# Patient Record
Sex: Male | Born: 1960 | Race: White | Hispanic: No | Smoking: Never smoker
Health system: Southern US, Community
[De-identification: ages and names within clinical notes are randomized; demographics above are authoritative.]

## PROBLEM LIST (undated history)

## (undated) DIAGNOSIS — C61 Malignant neoplasm of prostate: Secondary | ICD-10-CM

## (undated) DIAGNOSIS — I10 Essential (primary) hypertension: Secondary | ICD-10-CM

## (undated) DIAGNOSIS — M1990 Unspecified osteoarthritis, unspecified site: Secondary | ICD-10-CM

## (undated) DIAGNOSIS — G43909 Migraine, unspecified, not intractable, without status migrainosus: Secondary | ICD-10-CM

## (undated) HISTORY — PX: OTHER SURGICAL HISTORY: SHX169

## (undated) HISTORY — PX: BACK SURGERY: SHX140

## (undated) HISTORY — PX: WISDOM TOOTH EXTRACTION: SHX21

## (undated) HISTORY — PX: FRACTURE SURGERY: SHX138

---

## 1999-02-11 ENCOUNTER — Encounter: Payer: Self-pay | Admitting: Emergency Medicine

## 1999-02-11 ENCOUNTER — Emergency Department (HOSPITAL_COMMUNITY): Admission: EM | Admit: 1999-02-11 | Discharge: 1999-02-11 | Payer: Self-pay | Admitting: Emergency Medicine

## 2000-12-19 ENCOUNTER — Emergency Department (HOSPITAL_COMMUNITY): Admission: EM | Admit: 2000-12-19 | Discharge: 2000-12-19 | Payer: Self-pay | Admitting: Emergency Medicine

## 2003-05-28 ENCOUNTER — Emergency Department (HOSPITAL_COMMUNITY): Admission: EM | Admit: 2003-05-28 | Discharge: 2003-05-28 | Payer: Self-pay | Admitting: Emergency Medicine

## 2003-05-28 ENCOUNTER — Encounter: Payer: Self-pay | Admitting: Physical Medicine & Rehabilitation

## 2007-08-21 ENCOUNTER — Emergency Department: Payer: Self-pay | Admitting: Internal Medicine

## 2009-07-15 ENCOUNTER — Emergency Department (HOSPITAL_COMMUNITY): Admission: EM | Admit: 2009-07-15 | Discharge: 2009-07-15 | Payer: Self-pay | Admitting: Emergency Medicine
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
## 2016-09-17 DIAGNOSIS — Z0000 Encounter for general adult medical examination without abnormal findings: Secondary | ICD-10-CM | POA: Diagnosis not present

## 2016-09-17 DIAGNOSIS — Z125 Encounter for screening for malignant neoplasm of prostate: Secondary | ICD-10-CM | POA: Diagnosis not present

## 2016-09-17 DIAGNOSIS — E7800 Pure hypercholesterolemia, unspecified: Secondary | ICD-10-CM | POA: Diagnosis not present

## 2016-11-15 DIAGNOSIS — H43312 Vitreous membranes and strands, left eye: Secondary | ICD-10-CM | POA: Diagnosis not present

## 2016-11-15 DIAGNOSIS — H16223 Keratoconjunctivitis sicca, not specified as Sjogren's, bilateral: Secondary | ICD-10-CM | POA: Diagnosis not present

## 2016-11-15 DIAGNOSIS — H04123 Dry eye syndrome of bilateral lacrimal glands: Secondary | ICD-10-CM | POA: Diagnosis not present

## 2016-11-15 DIAGNOSIS — H524 Presbyopia: Secondary | ICD-10-CM | POA: Diagnosis not present

## 2017-03-19 DIAGNOSIS — E7800 Pure hypercholesterolemia, unspecified: Secondary | ICD-10-CM | POA: Diagnosis not present

## 2017-03-19 DIAGNOSIS — M79644 Pain in right finger(s): Secondary | ICD-10-CM | POA: Diagnosis not present

## 2017-03-19 DIAGNOSIS — I10 Essential (primary) hypertension: Secondary | ICD-10-CM | POA: Diagnosis not present

## 2017-10-03 DIAGNOSIS — Z1159 Encounter for screening for other viral diseases: Secondary | ICD-10-CM | POA: Diagnosis not present

## 2017-10-03 DIAGNOSIS — Z0000 Encounter for general adult medical examination without abnormal findings: Secondary | ICD-10-CM | POA: Diagnosis not present

## 2017-10-03 DIAGNOSIS — Z125 Encounter for screening for malignant neoplasm of prostate: Secondary | ICD-10-CM | POA: Diagnosis not present

## 2017-10-03 DIAGNOSIS — E7800 Pure hypercholesterolemia, unspecified: Secondary | ICD-10-CM | POA: Diagnosis not present

## 2018-04-14 DIAGNOSIS — E7800 Pure hypercholesterolemia, unspecified: Secondary | ICD-10-CM | POA: Diagnosis not present

## 2018-04-14 DIAGNOSIS — R972 Elevated prostate specific antigen [PSA]: Secondary | ICD-10-CM | POA: Diagnosis not present

## 2018-04-14 DIAGNOSIS — Z125 Encounter for screening for malignant neoplasm of prostate: Secondary | ICD-10-CM | POA: Diagnosis not present

## 2018-04-14 DIAGNOSIS — I10 Essential (primary) hypertension: Secondary | ICD-10-CM | POA: Diagnosis not present

## 2018-04-14 DIAGNOSIS — M25522 Pain in left elbow: Secondary | ICD-10-CM | POA: Diagnosis not present

## 2018-05-21 DIAGNOSIS — M7022 Olecranon bursitis, left elbow: Secondary | ICD-10-CM | POA: Diagnosis not present

## 2018-08-10 ENCOUNTER — Emergency Department: Payer: BLUE CROSS/BLUE SHIELD

## 2018-08-10 ENCOUNTER — Emergency Department
Admission: EM | Admit: 2018-08-10 | Discharge: 2018-08-10 | Disposition: A | Discharge status: Home / Self Care | Payer: BLUE CROSS/BLUE SHIELD | Attending: Emergency Medicine | Admitting: Emergency Medicine

## 2018-08-10 ENCOUNTER — Other Ambulatory Visit: Payer: Self-pay

## 2018-08-10 DIAGNOSIS — Y929 Unspecified place or not applicable: Secondary | ICD-10-CM | POA: Insufficient documentation

## 2018-08-10 DIAGNOSIS — S022XXA Fracture of nasal bones, initial encounter for closed fracture: Secondary | ICD-10-CM | POA: Insufficient documentation

## 2018-08-10 DIAGNOSIS — S0992XA Unspecified injury of nose, initial encounter: Secondary | ICD-10-CM | POA: Diagnosis not present

## 2018-08-10 DIAGNOSIS — W11XXXA Fall on and from ladder, initial encounter: Secondary | ICD-10-CM | POA: Insufficient documentation

## 2018-08-10 DIAGNOSIS — I10 Essential (primary) hypertension: Secondary | ICD-10-CM | POA: Diagnosis not present

## 2018-08-10 DIAGNOSIS — Y999 Unspecified external cause status: Secondary | ICD-10-CM | POA: Insufficient documentation

## 2018-08-10 DIAGNOSIS — S0121XA Laceration without foreign body of nose, initial encounter: Secondary | ICD-10-CM | POA: Diagnosis not present

## 2018-08-10 DIAGNOSIS — Y939 Activity, unspecified: Secondary | ICD-10-CM | POA: Diagnosis not present

## 2018-08-10 HISTORY — DX: Essential (primary) hypertension: I10

## 2018-08-10 MED ORDER — DOXYCYCLINE HYCLATE 100 MG PO TABS: 100.0000 mg | ORAL_TABLET | Freq: Two times a day (BID) | ORAL | 0 refills | Status: DC

## 2018-08-10 NOTE — ED Triage Notes (Addendum)
Fell on wooden deck and hit nose. Bleeding controlled at this time. Pt cleaned face at home. Bandage over nose. Unsure when last tetanus was.

## 2018-08-10 NOTE — ED Notes (Signed)
Laceration on bridge of nose cleaned and irrigated with irrigation solution. Lacerations in in a v shape around 1cm wide. When irrigating wound small amount of bleeding occurred from the nostrils.

## 2018-08-10 NOTE — ED Provider Notes (Signed)
[facility] Emergency Department Provider Note   ____________________________________________    I have reviewed the triage vital signs and the nursing notes.   HISTORY  Chief Complaint Fall and Facial Injury     HPI Adam Cooley is a 57 y.o. male presents after a fall while working on a ladder.  Patient reports the ladder shifted and he fell forward about 3 feet hitting his nose on a deck.  He reports his nose bled significantly afterwards but has now mostly stopped.  He complains of pain only in his nose.  No LOC.  No neck pain.  No chest pain.  Does have some soreness to his knees but is ambulating well.  No neuro deficits, no headache  Past Medical History:  Diagnosis Date  . Hypertension     There are no active problems to display for this patient.   Past Surgical History:  Procedure Laterality Date  . BACK SURGERY    . toe removal      Prior to Admission medications   Medication Sig Start Date End Date Taking? Authorizing Provider  doxycycline (VIBRA-TABS) 100 MG tablet Take 1 tablet (100 mg total) by mouth 2 (two) times daily. 08/10/18   Lavonia Drafts, MD     Allergies Erythromycin; Keflex [cephalexin]; and Penicillins  History reviewed. No pertinent family history.  Social History Social History   Tobacco Use  . Smoking status: Never Smoker  Substance Use Topics  . Alcohol use: Never    Frequency: Never  . Drug use: Not on file    Review of Systems  Constitutional: No LOC  ENT: Nosebleed   Gastrointestinal: No abdominal pain.  No nausea, no vomiting.   Genitourinary: No groin injury Musculoskeletal: No back pain Skin: Abrasion to the nose Neurological: Negative for headaches, no weakness    ____________________________________________   PHYSICAL EXAM:  VITAL SIGNS: ED Triage Vitals  Enc Vitals Group     BP 08/10/18 1800 (!) 153/94     Pulse Rate 08/10/18 1800 66     Resp 08/10/18 1800 15     Temp  08/10/18 1800 98.6 F (37 C)     Temp Source 08/10/18 1800 Oral     SpO2 08/10/18 1800 100 %     Weight 08/10/18 1805 90.7 kg (200 lb)     Height 08/10/18 1805 1.727 m (5\' 8" )     Head Circumference --      Peak Flow --      Pain Score 08/10/18 1805 0     Pain Loc --      Pain Edu? --      Excl. in [place]? --      Constitutional: Alert and oriented.  Eyes: Conjunctivae are normal.  Head: Atraumatic. Nose: Dried blood both nares, no septal hematoma.  Mild swelling of the bridge of the nose, mild deformity.  Deep abrasion to the bridge of the nose, does not appear to be through and through Mouth/Throat: Mucous membranes are moist.   Cardiovascular: Normal rate, regular rhythm.  No chest wall tenderness to palpation Respiratory: Normal respiratory effort.  No retractions.  Musculoskeletal: Full range of motion of all extremities Neurologic:  Normal speech and language. No gross focal neurologic deficits are appreciated.   Skin:  Skin is warm, dry and intact   ____________________________________________   LABS (all labs ordered are listed, but only abnormal results are displayed)  Labs Reviewed - No data to display ____________________________________________  EKG  ____________________________________________  RADIOLOGY  Comminuted minimally displaced nasal bone fractures ____________________________________________   PROCEDURES  Procedure(s) performed: yes  .Marland KitchenLaceration Repair Date/Time: 08/10/2018 10:56 PM Performed by: Lavonia Drafts, MD Authorized by: Lavonia Drafts, MD   Consent:    Consent obtained:  Verbal   Consent given by:  Patient   Risks discussed:  Infection, pain, retained foreign body, poor cosmetic result and poor wound healing Repair type:    Repair type:  Simple Exploration:    Hemostasis achieved with:  Direct pressure   Contaminated: no   Treatment:    Area cleansed with:  Saline   Amount of cleaning:  Extensive   Irrigation solution:   Sterile saline   Visualized foreign bodies/material removed: no   Skin repair:    Repair method:  Tissue adhesive Approximation:    Approximation:  Close Post-procedure details:    Dressing:  Sterile dressing   Patient tolerance of procedure:  Tolerated well, no immediate complications     Critical Care performed: No ____________________________________________   INITIAL IMPRESSION / ASSESSMENT AND PLAN / ED COURSE  Pertinent labs & imaging results that were available during my care of the patient were reviewed by me and considered in my medical decision making (see chart for details).  Patient presents with nasal fracture, deep abrasion overlying the bridge of the nose which I Dermabonded after extensive cleaning we will start the patient on antibiotics, he has multiple allergies and have him follow-up with ENT   ____________________________________________   FINAL CLINICAL IMPRESSION(S) / ED DIAGNOSES  Final diagnoses:  Closed fracture of nasal bone, initial encounter      NEW MEDICATIONS STARTED DURING THIS VISIT:  Discharge Medication List as of 08/10/2018  7:49 PM    START taking these medications   Details  doxycycline (VIBRA-TABS) 100 MG tablet Take 1 tablet (100 mg total) by mouth 2 (two) times daily., Starting Sun 08/10/2018, Print         Note:  This document was prepared using Dragon voice recognition software and may include unintentional dictation errors.    Lavonia Drafts, MD 08/10/18 [phone]

## 2018-10-24 DIAGNOSIS — R972 Elevated prostate specific antigen [PSA]: Secondary | ICD-10-CM | POA: Diagnosis not present

## 2018-10-24 DIAGNOSIS — I10 Essential (primary) hypertension: Secondary | ICD-10-CM | POA: Diagnosis not present

## 2018-10-24 DIAGNOSIS — Z0000 Encounter for general adult medical examination without abnormal findings: Secondary | ICD-10-CM | POA: Diagnosis not present

## 2019-04-06 ENCOUNTER — Other Ambulatory Visit: Payer: Self-pay | Admitting: Family Medicine

## 2019-04-06 DIAGNOSIS — R109 Unspecified abdominal pain: Secondary | ICD-10-CM | POA: Diagnosis not present

## 2019-04-06 DIAGNOSIS — I10 Essential (primary) hypertension: Secondary | ICD-10-CM | POA: Diagnosis not present

## 2019-04-06 DIAGNOSIS — E7800 Pure hypercholesterolemia, unspecified: Secondary | ICD-10-CM | POA: Diagnosis not present

## 2019-04-10 ENCOUNTER — Ambulatory Visit
Admission: RE | Admit: 2019-04-10 | Discharge: 2019-04-10 | Disposition: A | Discharge status: Home / Self Care | Payer: BLUE CROSS/BLUE SHIELD | Source: Ambulatory Visit | Attending: Family Medicine | Admitting: Family Medicine

## 2019-04-10 DIAGNOSIS — R109 Unspecified abdominal pain: Secondary | ICD-10-CM

## 2019-04-10 DIAGNOSIS — E7800 Pure hypercholesterolemia, unspecified: Secondary | ICD-10-CM | POA: Diagnosis not present

## 2019-04-10 DIAGNOSIS — Z5181 Encounter for therapeutic drug level monitoring: Secondary | ICD-10-CM | POA: Diagnosis not present

## 2019-04-10 DIAGNOSIS — K7689 Other specified diseases of liver: Secondary | ICD-10-CM | POA: Diagnosis not present

## 2019-04-10 DIAGNOSIS — K828 Other specified diseases of gallbladder: Secondary | ICD-10-CM | POA: Diagnosis not present

## 2019-04-10 DIAGNOSIS — R972 Elevated prostate specific antigen [PSA]: Secondary | ICD-10-CM | POA: Diagnosis not present

## 2019-04-14 ENCOUNTER — Other Ambulatory Visit: Payer: BLUE CROSS/BLUE SHIELD

## 2019-05-20 DIAGNOSIS — K8010 Calculus of gallbladder with chronic cholecystitis without obstruction: Secondary | ICD-10-CM | POA: Diagnosis not present

## 2019-06-17 ENCOUNTER — Ambulatory Visit: Payer: Self-pay | Admitting: Surgery

## 2019-06-17 NOTE — Patient Instructions (Signed)
DUE TO COVID-19 ONLY ONE VISITOR IS ALLOWED TO COME WITH YOU AND STAY IN THE WAITING ROOM ONLY DURING PRE OP AND PROCEDURE DAY OF SURGERY. THE 1 VISITOR MAY VISIT WITH YOU AFTER SURGERY IN YOUR PRIVATE ROOM DURING VISITING HOURS ONLY!  YOU NEED TO HAVE A COVID 19 TEST ON_______ @_______ , THIS TEST MUST BE DONE BEFORE SURGERY, COME  [address] , [id number].  ([place]) ONCE YOUR COVID TEST IS COMPLETED, PLEASE BEGIN THE QUARANTINE INSTRUCTIONS AS OUTLINED IN YOUR HANDOUT.                FREDERICH STELLHORN  06/17/2019   Your procedure is scheduled on: 06-22-19   Report to [facility] Main  Entrance   Report to  Short stay at    0530 AM     Call this number if you have problems the morning of surgery [phone]    Remember: Do not eat food or drink liquids :After Midnight.   BRUSH YOUR TEETH MORNING OF SURGERY AND RINSE YOUR MOUTH OUT, NO CHEWING GUM CANDY OR MINTS.     Take these medicines the morning of surgery with A SIP OF WATER: prevastatin                                You may not have any metal on your body including hair pins and              piercings  Do not wear jewelry,  lotions, powders or perfumes, deodorant                     Men may shave face and neck.   Do not bring valuables to the hospital. [place].  Contacts, dentures or bridgework may not be worn into surgery.      Patients discharged the day of surgery will not be allowed to drive home. IF YOU ARE HAVING SURGERY AND GOING HOME THE SAME DAY, YOU MUST HAVE AN ADULT TO DRIVE YOU HOME AND BE WITH YOU FOR 24 HOURS. YOU MAY GO HOME BY TAXI OR UBER OR ORTHERWISE, BUT AN ADULT MUST ACCOMPANY YOU HOME AND STAY WITH YOU FOR 24 HOURS.  Name and phone number of your driver:  Special Instructions: N/A              Please read over the following fact sheets you were  given: _____________________________________________________________________             [facility] - Preparing for Surgery Before surgery, you can play an important role.  Because skin is not sterile, your skin needs to be as free of germs as possible.  You can reduce the number of germs on your skin by washing with CHG (chlorahexidine gluconate) soap before surgery.  CHG is an antiseptic cleaner which kills germs and bonds with the skin to continue killing germs even after washing. Please DO NOT use if you have an allergy to CHG or antibacterial soaps.  If your skin becomes reddened/irritated stop using the CHG and inform your nurse when you arrive at Short Stay. Do not shave (including legs and underarms) for at least 48 hours prior to the first CHG shower.  You may shave your face/neck. Please follow these instructions carefully:  1.  Shower with CHG Soap the night before surgery and the  morning of Surgery.  2.  If you choose to wash your hair, wash your hair first as usual with your  normal  shampoo.  3.  After you shampoo, rinse your hair and body thoroughly to remove the  shampoo.                           4.  Use CHG as you would any other liquid soap.  You can apply chg directly  to the skin and wash                       Gently with a scrungie or clean washcloth.  5.  Apply the CHG Soap to your body ONLY FROM THE NECK DOWN.   Do not use on face/ open                           Wound or open sores. Avoid contact with eyes, ears mouth and genitals (private parts).                       Wash face,  Genitals (private parts) with your normal soap.             6.  Wash thoroughly, paying special attention to the area where your surgery  will be performed.  7.  Thoroughly rinse your body with warm water from the neck down.  8.  DO NOT shower/wash with your normal soap after using and rinsing off  the CHG Soap.                9.  Pat yourself dry with a clean towel.            10.  Wear  clean pajamas.            11.  Place clean sheets on your bed the night of your first shower and do not  sleep with pets. Day of Surgery : Do not apply any lotions/deodorants the morning of surgery.  Please wear clean clothes to the hospital/surgery center.  FAILURE TO FOLLOW THESE INSTRUCTIONS MAY RESULT IN THE CANCELLATION OF YOUR SURGERY PATIENT SIGNATURE_________________________________  NURSE SIGNATURE__________________________________  ________________________________________________________________________

## 2019-06-18 ENCOUNTER — Other Ambulatory Visit (HOSPITAL_COMMUNITY)
Admission: RE | Admit: 2019-06-18 | Discharge: 2019-06-18 | Disposition: A | Discharge status: Home / Self Care | Payer: BC Managed Care – PPO | Source: Ambulatory Visit | Attending: Surgery | Admitting: Surgery

## 2019-06-18 ENCOUNTER — Other Ambulatory Visit: Payer: Self-pay

## 2019-06-18 ENCOUNTER — Encounter (HOSPITAL_COMMUNITY)
Admission: RE | Admit: 2019-06-18 | Discharge: 2019-06-18 | Disposition: A | Discharge status: Home / Self Care | Payer: BC Managed Care – PPO | Source: Ambulatory Visit | Attending: Surgery | Admitting: Surgery

## 2019-06-18 ENCOUNTER — Encounter (HOSPITAL_COMMUNITY): Payer: Self-pay

## 2019-06-18 DIAGNOSIS — Z01818 Encounter for other preprocedural examination: Secondary | ICD-10-CM | POA: Insufficient documentation

## 2019-06-18 DIAGNOSIS — K8080 Other cholelithiasis without obstruction: Secondary | ICD-10-CM | POA: Insufficient documentation

## 2019-06-18 DIAGNOSIS — Z20828 Contact with and (suspected) exposure to other viral communicable diseases: Secondary | ICD-10-CM | POA: Diagnosis not present

## 2019-06-18 HISTORY — DX: Unspecified osteoarthritis, unspecified site: M19.90

## 2019-06-18 LAB — BASIC METABOLIC PANEL
Anion gap: 9 (ref 5–15)
BUN: 13 mg/dL (ref 6–20)
CO2: 31 mmol/L (ref 22–32)
Calcium: 9.8 mg/dL (ref 8.9–10.3)
Chloride: 101 mmol/L (ref 98–111)
Creatinine, Ser: 1.06 mg/dL (ref 0.61–1.24)
GFR calc Af Amer: >60 mL/min (ref >60)
GFR calc non Af Amer: >60 mL/min (ref >60)
Glucose, Bld: 109 mg/dL — ABNORMAL HIGH (ref 70–99)
Potassium: 4.5 mmol/L (ref 3.5–5.1)
Sodium: 141 mmol/L (ref 135–145)

## 2019-06-18 LAB — CBC
HCT: 49 % (ref 39.0–52.0)
Hemoglobin: 16.6 g/dL (ref 13.0–17.0)
MCH: 31.5 pg (ref 26.0–34.0)
MCHC: 33.9 g/dL (ref 30.0–36.0)
MCV: 93 fL (ref 80.0–100.0)
Platelets: 245 10*3/uL (ref 150–400)
RBC: 5.27 MIL/uL (ref 4.22–5.81)
RDW: 12.4 % (ref 11.5–15.5)
WBC: 7.2 10*3/uL (ref 4.0–10.5)
nRBC: 0 % (ref 0.0–0.2)

## 2019-06-21 LAB — NOVEL CORONAVIRUS, NAA (HOSP ORDER, SEND-OUT TO REF LAB; TAT 18-24 HRS): SARS-CoV-2, NAA: NOT DETECTED

## 2019-06-21 MED ORDER — BUPIVACAINE LIPOSOME 1.3 % IJ SUSP
20.0000 mL | Freq: Once | INTRAMUSCULAR | Status: DC
  Filled 2019-06-21: qty 20

## 2019-06-21 NOTE — H&P (Signed)
Chief Complaint:  gallstones  History of Present Illness:  Adam Cooley is an 58 y.o. male who was seen in September with gallstones.  Ultrasound confirmed.   Past Medical History:  Diagnosis Date  . Arthritis    hands  . Headache   . Hypertension     Past Surgical History:  Procedure Laterality Date  . BACK SURGERY    . FRACTURE SURGERY     right hand boxer fracture  . left hand middle finger reatached     . toe removal     left great toe    No current facility-administered medications for this encounter.    Current Outpatient Medications  Medication Sig Dispense Refill  . acetaminophen (TYLENOL) 500 MG tablet Take 1,000 mg by mouth every 6 (six) hours as needed for moderate pain or headache.    . Ascorbic Acid (VITAMIN C PO) Take 2 tablets by mouth daily.    . Carboxymethylcellul-Glycerin (LUBRICATING EYE DROPS OP) Place 1 drop into both eyes daily as needed (dry eyes).    . ezetimibe (ZETIA) 10 MG tablet Take 10 mg by mouth every evening.    . fexofenadine (ALLEGRA) 180 MG tablet Take 180 mg by mouth daily as needed for allergies or rhinitis.    . hydrochlorothiazide (HYDRODIURIL) 25 MG tablet Take 25 mg by mouth every evening.    Marland Kitchen ibuprofen (ADVIL) 200 MG tablet Take 400 mg by mouth every 6 (six) hours as needed for headache or moderate pain.    . pravastatin (PRAVACHOL) 40 MG tablet Take 40 mg by mouth every Monday, Wednesday, and Friday.     Erythromycin, Keflex [cephalexin], and Penicillins No family history on file. Social History:   reports that he has never smoked. He has never used smokeless tobacco. He reports that he does not drink alcohol or use drugs.   REVIEW OF SYSTEMS : Negative except for see problem list  Physical Exam:   There were no vitals taken for this visit. There is no height or weight on file to calculate BMI.  Gen:  WDWN WM NAD  Neurological: Alert and oriented to person, place, and time. Motor and sensory function is grossly intact   Head: Normocephalic and atraumatic.  Eyes: Conjunctivae are normal. Pupils are equal, round, and reactive to light. No scleral icterus.  Neck: Normal range of motion. Neck supple. No tracheal deviation or thyromegaly present.  Cardiovascular:  SR without murmurs or gallops.  No carotid bruits Breast:  Not examined Respiratory: Effort normal.  No respiratory distress. No chest wall tenderness. Breath sounds normal.  No wheezes, rales or rhonchi.  Abdomen:  nontender  GU:  unremarkable Musculoskeletal: Normal range of motion. Extremities are nontender. No cyanosis, edema or clubbing noted Lymphadenopathy: No cervical, preauricular, postauricular or axillary adenopathy is present Skin: Skin is warm and dry. No rash noted. No diaphoresis. No erythema. No pallor. Pscyh: Normal mood and affect. Behavior is normal. Judgment and thought content normal.   LABORATORY RESULTS: No results found for this or any previous visit (from the past 48 hour(s)).   RADIOLOGY RESULTS: No results found.  Problem List: There are no active problems to display for this patient.   Assessment & Plan: Chronic cholecystitis with gallstones--for lap cholecystectomy    Matt B. Hassell Done, MD, [facility] [phone] beeper [phone]  06/21/2019 11:53 AM

## 2019-06-22 ENCOUNTER — Ambulatory Visit (HOSPITAL_COMMUNITY): Payer: BC Managed Care – PPO | Admitting: Anesthesiology

## 2019-06-22 ENCOUNTER — Ambulatory Visit (HOSPITAL_COMMUNITY)
Admission: RE | Admit: 2019-06-22 | Discharge: 2019-06-22 | Disposition: A | Discharge status: Home / Self Care | Payer: BC Managed Care – PPO | Attending: Surgery | Admitting: Surgery

## 2019-06-22 ENCOUNTER — Encounter (HOSPITAL_COMMUNITY)
Admission: RE | Disposition: A | Discharge status: Home / Self Care | Payer: Self-pay | Source: Home / Self Care | Attending: Surgery

## 2019-06-22 ENCOUNTER — Other Ambulatory Visit: Payer: Self-pay

## 2019-06-22 ENCOUNTER — Encounter (HOSPITAL_COMMUNITY): Payer: Self-pay | Admitting: *Deleted

## 2019-06-22 ENCOUNTER — Ambulatory Visit (HOSPITAL_COMMUNITY): Payer: BC Managed Care – PPO

## 2019-06-22 DIAGNOSIS — K828 Other specified diseases of gallbladder: Secondary | ICD-10-CM | POA: Insufficient documentation

## 2019-06-22 DIAGNOSIS — K3189 Other diseases of stomach and duodenum: Secondary | ICD-10-CM | POA: Diagnosis not present

## 2019-06-22 DIAGNOSIS — Z79899 Other long term (current) drug therapy: Secondary | ICD-10-CM | POA: Insufficient documentation

## 2019-06-22 DIAGNOSIS — M19042 Primary osteoarthritis, left hand: Secondary | ICD-10-CM | POA: Diagnosis not present

## 2019-06-22 DIAGNOSIS — K8010 Calculus of gallbladder with chronic cholecystitis without obstruction: Secondary | ICD-10-CM | POA: Insufficient documentation

## 2019-06-22 DIAGNOSIS — I10 Essential (primary) hypertension: Secondary | ICD-10-CM | POA: Insufficient documentation

## 2019-06-22 DIAGNOSIS — K811 Chronic cholecystitis: Secondary | ICD-10-CM | POA: Diagnosis not present

## 2019-06-22 DIAGNOSIS — Z419 Encounter for procedure for purposes other than remedying health state, unspecified: Secondary | ICD-10-CM

## 2019-06-22 DIAGNOSIS — M19041 Primary osteoarthritis, right hand: Secondary | ICD-10-CM | POA: Insufficient documentation

## 2019-06-22 DIAGNOSIS — K915 Postcholecystectomy syndrome: Secondary | ICD-10-CM | POA: Diagnosis not present

## 2019-06-22 HISTORY — PX: CHOLECYSTECTOMY: SHX55

## 2019-06-22 SURGERY — LAPAROSCOPIC CHOLECYSTECTOMY WITH INTRAOPERATIVE CHOLANGIOGRAM
Anesthesia: General | Site: Abdomen

## 2019-06-22 MED ORDER — LIDOCAINE 2% (20 MG/ML) 5 ML SYRINGE
INTRAMUSCULAR | Status: AC
  Filled 2019-06-22: qty 5

## 2019-06-22 MED ORDER — MIDAZOLAM HCL 2 MG/2ML IJ SOLN
INTRAMUSCULAR | Status: AC
  Filled 2019-06-22: qty 2

## 2019-06-22 MED ORDER — HYDROCODONE-ACETAMINOPHEN 5-325 MG PO TABS: 1.0000 | ORAL_TABLET | Freq: Four times a day (QID) | ORAL | 0 refills | Status: DC | PRN

## 2019-06-22 MED ORDER — ONDANSETRON HCL 4 MG/2ML IJ SOLN
INTRAMUSCULAR | Status: DC | PRN
  Administered 2019-06-22: 4 mg via INTRAVENOUS

## 2019-06-22 MED ORDER — FENTANYL CITRATE (PF) 250 MCG/5ML IJ SOLN
INTRAMUSCULAR | Status: AC
  Filled 2019-06-22: qty 5

## 2019-06-22 MED ORDER — SUGAMMADEX SODIUM 200 MG/2ML IV SOLN
INTRAVENOUS | Status: DC | PRN
  Administered 2019-06-22: 200 mg via INTRAVENOUS

## 2019-06-22 MED ORDER — RINGERS IRRIGATION IR SOLN
Status: DC | PRN
  Administered 2019-06-22: 1

## 2019-06-22 MED ORDER — CHLORHEXIDINE GLUCONATE CLOTH 2 % EX PADS: 6.0000 | MEDICATED_PAD | Freq: Once | CUTANEOUS | Status: DC

## 2019-06-22 MED ORDER — HEPARIN SODIUM (PORCINE) 5000 UNIT/ML IJ SOLN
5000.0000 [IU] | Freq: Once | INTRAMUSCULAR | Status: AC
  Administered 2019-06-22: 5000 [IU] via SUBCUTANEOUS
  Filled 2019-06-22: qty 1

## 2019-06-22 MED ORDER — LIDOCAINE 2% (20 MG/ML) 5 ML SYRINGE
INTRAMUSCULAR | Status: DC | PRN
  Administered 2019-06-22: 100 mg via INTRAVENOUS

## 2019-06-22 MED ORDER — BUPIVACAINE LIPOSOME 1.3 % IJ SUSP
INTRAMUSCULAR | Status: DC | PRN
  Administered 2019-06-22: 20 mL

## 2019-06-22 MED ORDER — FENTANYL CITRATE (PF) 100 MCG/2ML IJ SOLN
25.0000 ug | INTRAMUSCULAR | Status: DC | PRN
  Administered 2019-06-22 (×2): 50 ug via INTRAVENOUS

## 2019-06-22 MED ORDER — ONDANSETRON HCL 4 MG/2ML IJ SOLN: 4.0000 mg | Freq: Once | INTRAMUSCULAR | Status: DC | PRN

## 2019-06-22 MED ORDER — PROPOFOL 10 MG/ML IV BOLUS
INTRAVENOUS | Status: AC
  Filled 2019-06-22: qty 40

## 2019-06-22 MED ORDER — CEFAZOLIN SODIUM-DEXTROSE 2-4 GM/100ML-% IV SOLN
2.0000 g | INTRAVENOUS | Status: AC
  Administered 2019-06-22: 07:00:00 2 g via INTRAVENOUS
  Filled 2019-06-22: qty 100

## 2019-06-22 MED ORDER — 0.9 % SODIUM CHLORIDE (POUR BTL) OPTIME
TOPICAL | Status: DC | PRN
  Administered 2019-06-22: 1000 mL

## 2019-06-22 MED ORDER — ROCURONIUM BROMIDE 10 MG/ML (PF) SYRINGE
PREFILLED_SYRINGE | INTRAVENOUS | Status: AC
  Filled 2019-06-22: qty 10

## 2019-06-22 MED ORDER — LACTATED RINGERS IV SOLN
INTRAVENOUS | Status: DC
  Administered 2019-06-22: 07:00:00 via INTRAVENOUS

## 2019-06-22 MED ORDER — PHENYLEPHRINE 40 MCG/ML (10ML) SYRINGE FOR IV PUSH (FOR BLOOD PRESSURE SUPPORT)
PREFILLED_SYRINGE | INTRAVENOUS | Status: DC | PRN
  Administered 2019-06-22 (×3): 80 ug via INTRAVENOUS
  Administered 2019-06-22: 120 ug via INTRAVENOUS

## 2019-06-22 MED ORDER — FENTANYL CITRATE (PF) 100 MCG/2ML IJ SOLN: 25.0000 ug | INTRAMUSCULAR | Status: DC | PRN

## 2019-06-22 MED ORDER — OXYCODONE HCL 5 MG/5ML PO SOLN: 5.0000 mg | Freq: Once | ORAL | Status: DC | PRN

## 2019-06-22 MED ORDER — DEXAMETHASONE SODIUM PHOSPHATE 10 MG/ML IJ SOLN
INTRAMUSCULAR | Status: DC | PRN
  Administered 2019-06-22: 10 mg via INTRAVENOUS

## 2019-06-22 MED ORDER — FENTANYL CITRATE (PF) 100 MCG/2ML IJ SOLN
INTRAMUSCULAR | Status: AC
  Filled 2019-06-22: qty 4

## 2019-06-22 MED ORDER — PHENYLEPHRINE 40 MCG/ML (10ML) SYRINGE FOR IV PUSH (FOR BLOOD PRESSURE SUPPORT)
PREFILLED_SYRINGE | INTRAVENOUS | Status: AC
  Filled 2019-06-22: qty 10

## 2019-06-22 MED ORDER — PROPOFOL 10 MG/ML IV BOLUS
INTRAVENOUS | Status: DC | PRN
  Administered 2019-06-22: 160 mg via INTRAVENOUS

## 2019-06-22 MED ORDER — IOPAMIDOL (ISOVUE-300) INJECTION 61%
INTRAVENOUS | Status: DC | PRN
  Administered 2019-06-22: 5 mL

## 2019-06-22 MED ORDER — ONDANSETRON HCL 4 MG/2ML IJ SOLN
INTRAMUSCULAR | Status: AC
  Filled 2019-06-22: qty 2

## 2019-06-22 MED ORDER — OXYCODONE HCL 5 MG PO TABS: 5.0000 mg | ORAL_TABLET | Freq: Once | ORAL | Status: DC | PRN

## 2019-06-22 MED ORDER — MIDAZOLAM HCL 5 MG/5ML IJ SOLN
INTRAMUSCULAR | Status: DC | PRN
  Administered 2019-06-22: 2 mg via INTRAVENOUS

## 2019-06-22 MED ORDER — FENTANYL CITRATE (PF) 250 MCG/5ML IJ SOLN
INTRAMUSCULAR | Status: DC | PRN
  Administered 2019-06-22 (×3): 50 ug via INTRAVENOUS
  Administered 2019-06-22: 100 ug via INTRAVENOUS

## 2019-06-22 MED ORDER — ROCURONIUM BROMIDE 10 MG/ML (PF) SYRINGE
PREFILLED_SYRINGE | INTRAVENOUS | Status: DC | PRN
  Administered 2019-06-22: 50 mg via INTRAVENOUS

## 2019-06-22 MED ORDER — DEXAMETHASONE SODIUM PHOSPHATE 10 MG/ML IJ SOLN
INTRAMUSCULAR | Status: AC
  Filled 2019-06-22: qty 1

## 2019-06-22 MED ORDER — ACETAMINOPHEN 500 MG PO TABS
1000.0000 mg | ORAL_TABLET | ORAL | Status: AC
  Administered 2019-06-22: 1000 mg via ORAL
  Filled 2019-06-22: qty 2

## 2019-06-22 MED ORDER — SUCCINYLCHOLINE CHLORIDE 200 MG/10ML IV SOSY
PREFILLED_SYRINGE | INTRAVENOUS | Status: AC
  Filled 2019-06-22: qty 10

## 2019-06-22 SURGICAL SUPPLY — 38 items
APPLICATOR COTTON TIP 6 STRL (MISCELLANEOUS) ×2 IMPLANT
APPLICATOR COTTON TIP 6IN STRL (MISCELLANEOUS) ×4
APPLIER CLIP ROT 10 11.4 M/L (STAPLE) ×2
BENZOIN TINCTURE PRP APPL 2/3 (GAUZE/BANDAGES/DRESSINGS) IMPLANT
CABLE HIGH FREQUENCY MONO STRZ (ELECTRODE) ×2 IMPLANT
CATH REDDICK CHOLANGI 4FR 50CM (CATHETERS) ×2 IMPLANT
CLIP APPLIE ROT 10 11.4 M/L (STAPLE) ×1 IMPLANT
COVER MAYO STAND STRL (DRAPES) ×2 IMPLANT
COVER SURGICAL LIGHT HANDLE (MISCELLANEOUS) ×2 IMPLANT
COVER WAND RF STERILE (DRAPES) IMPLANT
DECANTER SPIKE VIAL GLASS SM (MISCELLANEOUS) ×2 IMPLANT
DERMABOND ADVANCED (GAUZE/BANDAGES/DRESSINGS) ×1
DERMABOND ADVANCED .7 DNX12 (GAUZE/BANDAGES/DRESSINGS) ×1 IMPLANT
DRAPE C-ARM 42X120 X-RAY (DRAPES) ×2 IMPLANT
ELECT PENCIL ROCKER SW 15FT (MISCELLANEOUS) IMPLANT
ELECT REM PT RETURN 15FT ADLT (MISCELLANEOUS) ×2 IMPLANT
GLOVE BIOGEL M 8.0 STRL (GLOVE) ×2 IMPLANT
GOWN STRL REUS W/TWL XL LVL3 (GOWN DISPOSABLE) ×6 IMPLANT
HEMOSTAT SURGICEL 4X8 (HEMOSTASIS) IMPLANT
IV CATH 14GX2 1/4 (CATHETERS) ×2 IMPLANT
KIT BASIN OR (CUSTOM PROCEDURE TRAY) ×2 IMPLANT
KIT TURNOVER KIT A (KITS) IMPLANT
L-HOOK LAP DISP 36CM (ELECTROSURGICAL)
LHOOK LAP DISP 36CM (ELECTROSURGICAL) IMPLANT
POUCH RETRIEVAL ECOSAC 10 (ENDOMECHANICALS) IMPLANT
POUCH RETRIEVAL ECOSAC 10MM (ENDOMECHANICALS)
SCISSORS LAP 5X45 EPIX DISP (ENDOMECHANICALS) ×2 IMPLANT
SET IRRIG TUBING LAPAROSCOPIC (IRRIGATION / IRRIGATOR) ×2 IMPLANT
SET TUBE SMOKE EVAC HIGH FLOW (TUBING) ×2 IMPLANT
SLEEVE XCEL OPT CAN 5 100 (ENDOMECHANICALS) ×2 IMPLANT
STRIP CLOSURE SKIN 1/2X4 (GAUZE/BANDAGES/DRESSINGS) IMPLANT
SUT MNCRL AB 4-0 PS2 18 (SUTURE) ×2 IMPLANT
SYR 20ML LL LF (SYRINGE) ×2 IMPLANT
TOWEL OR 17X26 10 PK STRL BLUE (TOWEL DISPOSABLE) ×2 IMPLANT
TRAY LAPAROSCOPIC (CUSTOM PROCEDURE TRAY) ×2 IMPLANT
TROCAR BLADELESS OPT 5 100 (ENDOMECHANICALS) ×6 IMPLANT
TROCAR XCEL BLUNT TIP 100MML (ENDOMECHANICALS) IMPLANT
TROCAR XCEL NON-BLD 11X100MML (ENDOMECHANICALS) ×2 IMPLANT

## 2019-06-22 NOTE — Discharge Instructions (Signed)

## 2019-06-22 NOTE — Anesthesia Procedure Notes (Signed)
Procedure Name: Intubation Date/Time: 06/22/2019 7:35 AM Performed by: Talbot Grumbling, CRNA Pre-anesthesia Checklist: Patient identified, Emergency Drugs available, Suction available and Patient being monitored Patient Re-evaluated:Patient Re-evaluated prior to induction Oxygen Delivery Method: Circle system utilized Preoxygenation: Pre-oxygenation with 100% oxygen Induction Type: IV induction Ventilation: Mask ventilation without difficulty Laryngoscope Size: Mac and 4 Grade View: Grade I Tube type: Oral Tube size: 7.5 mm Number of attempts: 1 Airway Equipment and Method: Stylet Placement Confirmation: ETT inserted through vocal cords under direct vision,  positive ETCO2 and breath sounds checked- equal and bilateral Secured at: 23 cm Tube secured with: Tape Dental Injury: Teeth and Oropharynx as per pre-operative assessment

## 2019-06-22 NOTE — Interval H&P Note (Signed)
History and Physical Interval Note:  06/22/2019 7:18 AM  Adam Cooley  has presented today for surgery, with the diagnosis of CHRONIC CHOLECYSTITIS.  The various methods of treatment have been discussed with the patient and family. After consideration of risks, benefits and other options for treatment, the patient has consented to  Procedure(s): LAPAROSCOPIC CHOLECYSTECTOMY WITH INTRAOPERATIVE CHOLANGIOGRAM (N/A) as a surgical intervention.  The patient's history has been reviewed, patient examined, no change in status, stable for surgery.  I have reviewed the patient's chart and labs.  Questions were answered to the patient's satisfaction.     Pedro Earls

## 2019-06-22 NOTE — Anesthesia Postprocedure Evaluation (Signed)
Anesthesia Post Note  Patient: Adam Cooley  Procedure(s) Performed: LAPAROSCOPIC CHOLECYSTECTOMY WITH INTRAOPERATIVE CHOLANGIOGRAM (N/A Abdomen)     Patient location during evaluation: PACU Anesthesia Type: General Level of consciousness: awake and alert Pain management: pain level controlled Vital Signs Assessment: post-procedure vital signs reviewed and stable Respiratory status: spontaneous breathing, nonlabored ventilation, respiratory function stable and patient connected to nasal cannula oxygen Cardiovascular status: blood pressure returned to baseline and stable Postop Assessment: no apparent nausea or vomiting Anesthetic complications: no    Last Vitals:  Vitals:   06/22/19 0944 06/22/19 1029  BP: (!) 167/87 (!) 150/87  Pulse: 64 72  Resp: 16 16  Temp: 36.7 C 36.6 C  SpO2: 97% 95%    Last Pain:  Vitals:   06/22/19 1029  TempSrc:   PainSc: Asleep                 Ibrahim Mcpheeters COKER

## 2019-06-22 NOTE — Op Note (Signed)
Adam Cooley  Primary Care Physician:  Alroy Dust, L.Marlou Sa, MD    06/22/2019  8:53 AM  Procedure: Laparoscopic Cholecystectomy with intraoperative cholangiogram  Surgeon: Catalina Antigua B. Hassell Done, MD, FACS Asst:  Gurney Maxin, MD, FACS  Anes:  General  Drains:  None  Findings: Chronic cholecystitis, normal IOC, small mass on fundus is marked for path  Description of Procedure: The patient was taken to OR 4 and given general anesthesia.  The patient was prepped with Hibiclens and draped sterilely. A time out was performed.  Access to the abdomen was achieved with a 5 mm Optiview through a right upper quadrant approach.  Port placement included three 5 mm trocars and an 11 in the upper midline.  This was closed at the end with a fig of 8 of 0 vicryl.    The gallbladder was visualized and the fundus was grasped and the gallbladder was elevated.  There was a small area on the fundus that may be a mass or adherent stone Traction on the infundibulum allowed for successful demonstration of the critical view. Inflammatory changes were chronic.  The cystic duct was identified and clipped up on the gallbladder and an incision was made in the cystic duct and the Reddick catheter was inserted after milking the cystic duct of any debris. A dynamic cholangiogram was performed which demonstrated intrahepatic filling and free flow into the duodenum.    The cystic duct was then triple clipped and divided, the cystic artery was double clipped and divided and then the gallbladder was removed from the gallbladder bed. Removal of the gallbladder from the gallbladder bed was through was performed with opening and  the upper midline trocar which had to be manually dilated to remove the large stone.  The gallbladder was then placed in a bag and brought out through one of the 11 mm  trocar sites.  The specimen was marked to look for a possible mass in the fundus. The gallbladder bed was inspected and no bleeding or bile leaks  were seen.   Incisions were injected with Exparel and closed with 4-0 Monocryl and Dermabond on the skin.  Sponge and needle count were correct.    The patient was taken to the recovery room in satisfactory condition.

## 2019-06-22 NOTE — Interval H&P Note (Signed)
History and Physical Interval Note:  06/22/2019 9:54 AM  Adam Cooley  has presented today for surgery, with the diagnosis of CHRONIC CHOLECYSTITIS.  The various methods of treatment have been discussed with the patient and family. After consideration of risks, benefits and other options for treatment, the patient has consented to  Procedure(s): LAPAROSCOPIC CHOLECYSTECTOMY WITH INTRAOPERATIVE CHOLANGIOGRAM (N/A) as a surgical intervention.  The patient's history has been reviewed, patient examined, no change in status, stable for surgery.  I have reviewed the patient's chart and labs.  Questions were answered to the patient's satisfaction.     Pedro Earls

## 2019-06-22 NOTE — Anesthesia Preprocedure Evaluation (Addendum)
Anesthesia Evaluation  Patient identified by MRN, date of birth, ID band Patient awake    Reviewed: Allergy & Precautions, NPO status , Patient's Chart, lab work & pertinent test results  Airway Mallampati: II  TM Distance: >3 FB Neck ROM: Full    Dental  (+) Teeth Intact, Dental Advisory Given   Pulmonary    breath sounds clear to auscultation       Cardiovascular hypertension,  Rhythm:Regular Rate:Normal     Neuro/Psych    GI/Hepatic   Endo/Other    Renal/GU      Musculoskeletal   Abdominal   Peds  Hematology   Anesthesia Other Findings   Reproductive/Obstetrics                             Anesthesia Physical Anesthesia Plan  ASA: II  Anesthesia Plan: General   Post-op Pain Management:    Induction: Intravenous  PONV Risk Score and Plan: Ondansetron and Dexamethasone  Airway Management Planned: Oral ETT  Additional Equipment:   Intra-op Plan:   Post-operative Plan: Extubation in OR  Informed Consent: I have reviewed the patients History and Physical, chart, labs and discussed the procedure including the risks, benefits and alternatives for the proposed anesthesia with the patient or authorized representative who has indicated his/her understanding and acceptance.     Dental advisory given  Plan Discussed with: Anesthesiologist and CRNA  Anesthesia Plan Comments:         Anesthesia Quick Evaluation  

## 2019-06-22 NOTE — Transfer of Care (Signed)
Immediate Anesthesia Transfer of Care Note  Patient: Adam Cooley  Procedure(s) Performed: LAPAROSCOPIC CHOLECYSTECTOMY WITH INTRAOPERATIVE CHOLANGIOGRAM (N/A Abdomen)  Patient Location: PACU  Anesthesia Type:General  Level of Consciousness: sedated  Airway & Oxygen Therapy: Patient Spontanous Breathing and Patient connected to face mask oxygen  Post-op Assessment: Report given to RN and Post -op Vital signs reviewed and stable  Post vital signs: Reviewed and stable  Last Vitals:  Vitals Value Taken Time  BP    Temp    Pulse    Resp    SpO2      Last Pain:  Vitals:   06/22/19 0626  TempSrc:   PainSc: 0-No pain      Patients Stated Pain Goal: 3 ([id number])  Complications: No apparent anesthesia complications

## 2019-06-23 ENCOUNTER — Encounter (HOSPITAL_COMMUNITY): Payer: Self-pay | Admitting: Surgery

## 2019-06-23 LAB — SURGICAL PATHOLOGY

## 2019-11-09 DIAGNOSIS — R972 Elevated prostate specific antigen [PSA]: Secondary | ICD-10-CM | POA: Diagnosis not present

## 2019-11-09 DIAGNOSIS — Z0000 Encounter for general adult medical examination without abnormal findings: Secondary | ICD-10-CM | POA: Diagnosis not present

## 2019-11-09 DIAGNOSIS — I10 Essential (primary) hypertension: Secondary | ICD-10-CM | POA: Diagnosis not present

## 2019-11-09 DIAGNOSIS — J309 Allergic rhinitis, unspecified: Secondary | ICD-10-CM | POA: Diagnosis not present

## 2019-11-09 DIAGNOSIS — E7800 Pure hypercholesterolemia, unspecified: Secondary | ICD-10-CM | POA: Diagnosis not present

## 2019-12-11 DIAGNOSIS — R972 Elevated prostate specific antigen [PSA]: Secondary | ICD-10-CM | POA: Diagnosis not present

## 2019-12-11 DIAGNOSIS — N400 Enlarged prostate without lower urinary tract symptoms: Secondary | ICD-10-CM | POA: Diagnosis not present

## 2020-01-08 DIAGNOSIS — C61 Malignant neoplasm of prostate: Secondary | ICD-10-CM | POA: Diagnosis not present

## 2020-01-08 DIAGNOSIS — D075 Carcinoma in situ of prostate: Secondary | ICD-10-CM | POA: Diagnosis not present

## 2020-01-08 DIAGNOSIS — R972 Elevated prostate specific antigen [PSA]: Secondary | ICD-10-CM | POA: Diagnosis not present

## 2020-01-14 DIAGNOSIS — C61 Malignant neoplasm of prostate: Secondary | ICD-10-CM | POA: Diagnosis not present

## 2020-01-28 ENCOUNTER — Other Ambulatory Visit: Payer: Self-pay | Admitting: Urology

## 2020-02-19 DIAGNOSIS — C61 Malignant neoplasm of prostate: Secondary | ICD-10-CM | POA: Diagnosis not present

## 2020-03-09 NOTE — Progress Notes (Addendum)
COVID Vaccine Completed: Yes Date COVID Vaccine completed: 10/2019 COVID vaccine manufacturer: Pfizer      PCP - Dr. Alroy Dust Cardiologist - N/A  Chest x-ray - N/A EKG - 06/18/2019 in epic Stress Test - N/A ECHO - N/A Cardiac Cath - N/A  Sleep Study - N/A CPAP - N/A  Fasting Blood Sugar - N/A Checks Blood Sugar __N/A___ times a day  Blood Thinner Instructions: N/A Aspirin Instructions: N/A Last Dose: N/A  Anesthesia review: N/A  Patient denies shortness of breath, fever, cough and chest pain at PAT appointment   Patient verbalized understanding of instructions that were given to them at the PAT appointment. Patient was also instructed that they will need to review over the PAT instructions again at home before surgery.

## 2020-03-09 NOTE — Patient Instructions (Addendum)
DUE TO COVID-19 ONLY ONE VISITOR ARE ALLOWED TO COME WITH YOU AND STAY IN THE WAITING ROOM ONLY DURING PRE OP AND PROCEDURE. THEN TWO VISITORS MAY VISIT WITH YOU IN YOUR PRIVATE ROOM DURING VISITING HOURS ONLY!!   COVID SWAB TESTING MUST BE COMPLETED ON: Thursday, March 17, 2020 at 10:45 AM    [address] [place] -[facility] enter pre surgical testing line (Must self quarantine after testing. Follow instructions on handout.)             Your procedure is scheduled on: Monday, March 21, 2020   Report to [facility] Main  Entrance   Report to Short Stay at 5:30 AM   [facility])   Call this number if you have problems the morning of surgery [phone]   Do not eat food :After Midnight.   May have liquids until 4:30 AM   day of surgery   CLEAR LIQUID DIET  Foods Allowed                                                                     Foods Excluded  Water, Black Coffee and tea, regular and decaf                             liquids that you cannot  Plain Jell-O in any flavor  (No red)                                           see through such as: Fruit ices (not with fruit pulp)                                     milk, soups, orange juice  Iced Popsicles (No red)                                    All solid food                                   Apple juices Sports drinks like Gatorade (No red) Lightly seasoned clear broth or consume(fat free) Sugar, honey syrup  Sample Menu Breakfast                                Lunch                                     Supper Cranberry juice                    Beef broth                            Chicken broth  Jell-O                                     Grape juice                           Apple juice Coffee or tea                        Jell-O                                      Popsicle                                                Coffee or tea                        Coffee or tea    Oral  Hygiene is also important to reduce your risk of infection.                                    Remember - BRUSH YOUR TEETH THE MORNING OF SURGERY WITH YOUR REGULAR TOOTHPASTE   Do NOT smoke after Midnight   Take these medicines the morning of surgery with A SIP OF WATER: None                               You may not have any metal on your body including  jewelry, and body piercings             Do not wear lotions, powders, perfumes/cologne, or deodorant                          Men may shave face and neck.   Do not bring valuables to the hospital. [facility].   Contacts, dentures or bridgework may not be worn into surgery.   Bring small overnight bag day of surgery.    Special Instructions: Bring a copy of your healthcare power of attorney and living will documents         the day of surgery if you haven't scanned them in before.              Please read over the following fact sheets you were given: IF YOU HAVE QUESTIONS ABOUT YOUR PRE OP INSTRUCTIONS PLEASE CALL [phone]   [place] - Preparing for Surgery Before surgery, you can play an important role.  Because skin is not sterile, your skin needs to be as free of germs as possible.  You can reduce the number of germs on your skin by washing with CHG (chlorahexidine gluconate) soap before surgery.  CHG is an antiseptic cleaner which kills germs and bonds with the skin to continue killing germs even after washing. Please DO NOT use if you have an allergy to CHG or antibacterial soaps.  If your skin becomes reddened/irritated stop using the CHG and inform your nurse when you arrive at Short Stay. Do not shave (including legs and underarms) for at least 48 hours prior to the first CHG shower.  You may shave your face/neck.  Please follow these instructions carefully:  1.  Shower with CHG Soap the night before surgery and the  morning of surgery.  2.  If you choose to wash your  hair, wash your hair first as usual with your normal  shampoo.  3.  After you shampoo, rinse your hair and body thoroughly to remove the shampoo.                             4.  Use CHG as you would any other liquid soap.  You can apply chg directly to the skin and wash.  Gently with a scrungie or clean washcloth.  5.  Apply the CHG Soap to your body ONLY FROM THE NECK DOWN.   Do   not use on face/ open                           Wound or open sores. Avoid contact with eyes, ears mouth and   genitals (private parts).                       Wash face,  Genitals (private parts) with your normal soap.             6.  Wash thoroughly, paying special attention to the area where your    surgery  will be performed.  7.  Thoroughly rinse your body with warm water from the neck down.  8.  DO NOT shower/wash with your normal soap after using and rinsing off the CHG Soap.                9.  Pat yourself dry with a clean towel.            10.  Wear clean pajamas.            11.  Place clean sheets on your bed the night of your first shower and do not  sleep with pets. Day of Surgery : Do not apply any lotions/deodorants the morning of surgery.  Please wear clean clothes to the hospital/surgery center.  FAILURE TO FOLLOW THESE INSTRUCTIONS MAY RESULT IN THE CANCELLATION OF YOUR SURGERY  PATIENT SIGNATURE_________________________________  NURSE SIGNATURE__________________________________  ________________________________________________________________________

## 2020-03-15 ENCOUNTER — Other Ambulatory Visit: Payer: Self-pay

## 2020-03-15 ENCOUNTER — Encounter (HOSPITAL_COMMUNITY): Payer: Self-pay

## 2020-03-15 ENCOUNTER — Encounter (HOSPITAL_COMMUNITY)
Admission: RE | Admit: 2020-03-15 | Discharge: 2020-03-15 | Disposition: A | Discharge status: Home / Self Care | Payer: BC Managed Care – PPO | Source: Ambulatory Visit | Attending: Urology | Admitting: Urology

## 2020-03-15 DIAGNOSIS — Z01812 Encounter for preprocedural laboratory examination: Secondary | ICD-10-CM | POA: Insufficient documentation

## 2020-03-15 HISTORY — DX: Malignant neoplasm of prostate: C61

## 2020-03-15 HISTORY — DX: Migraine, unspecified, not intractable, without status migrainosus: G43.909

## 2020-03-15 LAB — BASIC METABOLIC PANEL
Anion gap: 11 (ref 5–15)
BUN: 11 mg/dL (ref 8–23)
CO2: 29 mmol/L (ref 22–32)
Calcium: 9.4 mg/dL (ref 8.9–10.3)
Chloride: 101 mmol/L (ref 98–111)
Creatinine, Ser: 0.86 mg/dL (ref 0.61–1.24)
GFR calc Af Amer: >60 mL/min (ref >60)
GFR calc non Af Amer: >60 mL/min (ref >60)
Glucose, Bld: 107 mg/dL — ABNORMAL HIGH (ref 70–99)
Potassium: 3.4 mmol/L — ABNORMAL LOW (ref 3.5–5.1)
Sodium: 141 mmol/L (ref 135–145)

## 2020-03-15 LAB — CBC
HCT: 45.9 % (ref 39.0–52.0)
Hemoglobin: 15.8 g/dL (ref 13.0–17.0)
MCH: 31.4 pg (ref 26.0–34.0)
MCHC: 34.4 g/dL (ref 30.0–36.0)
MCV: 91.3 fL (ref 80.0–100.0)
Platelets: 229 10*3/uL (ref 150–400)
RBC: 5.03 MIL/uL (ref 4.22–5.81)
RDW: 12.4 % (ref 11.5–15.5)
WBC: 6.7 10*3/uL (ref 4.0–10.5)
nRBC: 0 % (ref 0.0–0.2)

## 2020-03-15 LAB — PROTIME-INR
INR: 1 (ref 0.8–1.2)
Prothrombin Time: 12.8 seconds (ref 11.4–15.2)

## 2020-03-17 ENCOUNTER — Other Ambulatory Visit (HOSPITAL_COMMUNITY)
Admission: RE | Admit: 2020-03-17 | Discharge: 2020-03-17 | Disposition: A | Discharge status: Home / Self Care | Payer: BC Managed Care – PPO | Source: Ambulatory Visit | Attending: Urology | Admitting: Urology

## 2020-03-17 DIAGNOSIS — M6281 Muscle weakness (generalized): Secondary | ICD-10-CM | POA: Diagnosis not present

## 2020-03-17 DIAGNOSIS — C61 Malignant neoplasm of prostate: Secondary | ICD-10-CM | POA: Diagnosis not present

## 2020-03-17 DIAGNOSIS — Z01812 Encounter for preprocedural laboratory examination: Secondary | ICD-10-CM | POA: Insufficient documentation

## 2020-03-17 DIAGNOSIS — Z20822 Contact with and (suspected) exposure to covid-19: Secondary | ICD-10-CM | POA: Diagnosis not present

## 2020-03-17 LAB — SARS CORONAVIRUS 2 (TAT 6-24 HRS): SARS Coronavirus 2: NEGATIVE

## 2020-03-21 ENCOUNTER — Ambulatory Visit (HOSPITAL_COMMUNITY): Payer: BC Managed Care – PPO | Admitting: Certified Registered"

## 2020-03-21 ENCOUNTER — Other Ambulatory Visit: Payer: Self-pay

## 2020-03-21 ENCOUNTER — Encounter (HOSPITAL_COMMUNITY)
Admission: RE | Disposition: A | Discharge status: Home / Self Care | Payer: Self-pay | Source: Ambulatory Visit | Attending: Urology

## 2020-03-21 ENCOUNTER — Observation Stay (HOSPITAL_COMMUNITY)
Admission: RE | Admit: 2020-03-21 | Discharge: 2020-03-22 | Disposition: A | Discharge status: Home / Self Care | Payer: BC Managed Care – PPO | Source: Ambulatory Visit | Attending: Urology | Admitting: Urology

## 2020-03-21 ENCOUNTER — Encounter (HOSPITAL_COMMUNITY): Payer: Self-pay | Admitting: Urology

## 2020-03-21 DIAGNOSIS — G43909 Migraine, unspecified, not intractable, without status migrainosus: Secondary | ICD-10-CM | POA: Diagnosis not present

## 2020-03-21 DIAGNOSIS — C61 Malignant neoplasm of prostate: Principal | ICD-10-CM | POA: Insufficient documentation

## 2020-03-21 DIAGNOSIS — M19049 Primary osteoarthritis, unspecified hand: Secondary | ICD-10-CM | POA: Diagnosis not present

## 2020-03-21 DIAGNOSIS — I10 Essential (primary) hypertension: Secondary | ICD-10-CM | POA: Insufficient documentation

## 2020-03-21 HISTORY — PX: PELVIC LYMPH NODE DISSECTION: SHX6543

## 2020-03-21 HISTORY — PX: ROBOT ASSISTED LAPAROSCOPIC RADICAL PROSTATECTOMY: SHX5141

## 2020-03-21 LAB — HEMOGLOBIN AND HEMATOCRIT, BLOOD
HCT: 44.9 % (ref 39.0–52.0)
Hemoglobin: 15.3 g/dL (ref 13.0–17.0)

## 2020-03-21 SURGERY — PROSTATECTOMY, RADICAL, ROBOT-ASSISTED, LAPAROSCOPIC
Anesthesia: General

## 2020-03-21 MED ORDER — FENTANYL CITRATE (PF) 100 MCG/2ML IJ SOLN
INTRAMUSCULAR | Status: DC | PRN
  Administered 2020-03-21: 50 ug via INTRAVENOUS
  Administered 2020-03-21 (×2): 25 ug via INTRAVENOUS
  Administered 2020-03-21: 50 ug via INTRAVENOUS

## 2020-03-21 MED ORDER — LIDOCAINE 2% (20 MG/ML) 5 ML SYRINGE
INTRAMUSCULAR | Status: AC
  Filled 2020-03-21: qty 5

## 2020-03-21 MED ORDER — OXYCODONE HCL 5 MG PO TABS
5.0000 mg | ORAL_TABLET | ORAL | Status: DC | PRN
  Administered 2020-03-21 – 2020-03-22 (×5): 5 mg via ORAL
  Filled 2020-03-21 (×5): qty 1

## 2020-03-21 MED ORDER — DEXAMETHASONE SODIUM PHOSPHATE 10 MG/ML IJ SOLN
INTRAMUSCULAR | Status: AC
  Filled 2020-03-21: qty 1

## 2020-03-21 MED ORDER — ROCURONIUM BROMIDE 10 MG/ML (PF) SYRINGE
PREFILLED_SYRINGE | INTRAVENOUS | Status: AC
  Filled 2020-03-21: qty 10

## 2020-03-21 MED ORDER — MIDAZOLAM HCL 2 MG/2ML IJ SOLN
INTRAMUSCULAR | Status: AC
  Filled 2020-03-21: qty 2

## 2020-03-21 MED ORDER — DIPHENHYDRAMINE HCL 12.5 MG/5ML PO ELIX: 12.5000 mg | ORAL_SOLUTION | Freq: Four times a day (QID) | ORAL | Status: DC | PRN

## 2020-03-21 MED ORDER — LIDOCAINE 2% (20 MG/ML) 5 ML SYRINGE
INTRAMUSCULAR | Status: DC | PRN
  Administered 2020-03-21: 40 mg via INTRAVENOUS

## 2020-03-21 MED ORDER — ORAL CARE MOUTH RINSE: 15.0000 mL | Freq: Once | OROMUCOSAL | Status: AC

## 2020-03-21 MED ORDER — ACETAMINOPHEN 10 MG/ML IV SOLN
INTRAVENOUS | Status: AC
  Administered 2020-03-21: 1000 mg via INTRAVENOUS
  Filled 2020-03-21: qty 100

## 2020-03-21 MED ORDER — ACETAMINOPHEN 325 MG PO TABS: 650.0000 mg | ORAL_TABLET | ORAL | Status: DC | PRN

## 2020-03-21 MED ORDER — MEPERIDINE HCL 50 MG/ML IJ SOLN: 6.2500 mg | INTRAMUSCULAR | Status: DC | PRN

## 2020-03-21 MED ORDER — STERILE WATER FOR IRRIGATION IR SOLN
Status: DC | PRN
  Administered 2020-03-21: 1000 mL

## 2020-03-21 MED ORDER — EZETIMIBE 10 MG PO TABS
10.0000 mg | ORAL_TABLET | Freq: Every evening | ORAL | Status: DC
  Administered 2020-03-21: 10 mg via ORAL
  Filled 2020-03-21: qty 1

## 2020-03-21 MED ORDER — DIPHENHYDRAMINE HCL 50 MG/ML IJ SOLN: 12.5000 mg | Freq: Four times a day (QID) | INTRAMUSCULAR | Status: DC | PRN

## 2020-03-21 MED ORDER — HYDROCODONE-ACETAMINOPHEN 5-325 MG PO TABS: 1.0000 | ORAL_TABLET | Freq: Four times a day (QID) | ORAL | 0 refills | Status: AC | PRN

## 2020-03-21 MED ORDER — ONDANSETRON HCL 4 MG/2ML IJ SOLN
INTRAMUSCULAR | Status: DC | PRN
  Administered 2020-03-21: 4 mg via INTRAVENOUS

## 2020-03-21 MED ORDER — BUPIVACAINE LIPOSOME 1.3 % IJ SUSP
20.0000 mL | Freq: Once | INTRAMUSCULAR | Status: AC
  Administered 2020-03-21: 20 mL
  Filled 2020-03-21: qty 20

## 2020-03-21 MED ORDER — CHLORHEXIDINE GLUCONATE CLOTH 2 % EX PADS
6.0000 | MEDICATED_PAD | Freq: Every day | CUTANEOUS | Status: DC
  Administered 2020-03-21 – 2020-03-22 (×2): 6 via TOPICAL

## 2020-03-21 MED ORDER — SODIUM CHLORIDE (PF) 0.9 % IJ SOLN
INTRAMUSCULAR | Status: AC
  Filled 2020-03-21: qty 10

## 2020-03-21 MED ORDER — ROCURONIUM BROMIDE 10 MG/ML (PF) SYRINGE
PREFILLED_SYRINGE | INTRAVENOUS | Status: DC | PRN
  Administered 2020-03-21: 10 mg via INTRAVENOUS
  Administered 2020-03-21: 70 mg via INTRAVENOUS
  Administered 2020-03-21: 10 mg via INTRAVENOUS

## 2020-03-21 MED ORDER — LACTATED RINGERS IV SOLN: INTRAVENOUS | Status: DC

## 2020-03-21 MED ORDER — EPHEDRINE 5 MG/ML INJ
INTRAVENOUS | Status: AC
  Filled 2020-03-21: qty 10

## 2020-03-21 MED ORDER — ACETAMINOPHEN 160 MG/5ML PO SOLN: 325.0000 mg | Freq: Once | ORAL | Status: DC | PRN

## 2020-03-21 MED ORDER — HYDROMORPHONE HCL 1 MG/ML IJ SOLN
INTRAMUSCULAR | Status: AC
  Filled 2020-03-21: qty 1

## 2020-03-21 MED ORDER — CIPROFLOXACIN IN D5W 400 MG/200ML IV SOLN
400.0000 mg | INTRAVENOUS | Status: AC
  Administered 2020-03-21: 400 mg via INTRAVENOUS
  Filled 2020-03-21: qty 200

## 2020-03-21 MED ORDER — LACTATED RINGERS IR SOLN
Status: DC | PRN
  Administered 2020-03-21: 1000 mL

## 2020-03-21 MED ORDER — HYDROMORPHONE HCL 1 MG/ML IJ SOLN: 0.5000 mg | INTRAMUSCULAR | Status: DC | PRN

## 2020-03-21 MED ORDER — HYDROCHLOROTHIAZIDE 25 MG PO TABS
25.0000 mg | ORAL_TABLET | Freq: Every evening | ORAL | Status: DC
  Administered 2020-03-21: 25 mg via ORAL
  Filled 2020-03-21: qty 1

## 2020-03-21 MED ORDER — SUGAMMADEX SODIUM 200 MG/2ML IV SOLN
INTRAVENOUS | Status: DC | PRN
  Administered 2020-03-21: 200 mg via INTRAVENOUS

## 2020-03-21 MED ORDER — PROPOFOL 10 MG/ML IV BOLUS
INTRAVENOUS | Status: AC
  Filled 2020-03-21: qty 20

## 2020-03-21 MED ORDER — BELLADONNA ALKALOIDS-OPIUM 16.2-60 MG RE SUPP
1.0000 | Freq: Four times a day (QID) | RECTAL | Status: DC | PRN
  Administered 2020-03-22: 1 via RECTAL
  Filled 2020-03-21: qty 1

## 2020-03-21 MED ORDER — DOCUSATE SODIUM 100 MG PO CAPS
100.0000 mg | ORAL_CAPSULE | Freq: Two times a day (BID) | ORAL | Status: DC
  Administered 2020-03-21 – 2020-03-22 (×2): 100 mg via ORAL
  Filled 2020-03-21 (×2): qty 1

## 2020-03-21 MED ORDER — FENTANYL CITRATE (PF) 100 MCG/2ML IJ SOLN
INTRAMUSCULAR | Status: AC
  Filled 2020-03-21: qty 2

## 2020-03-21 MED ORDER — SODIUM CHLORIDE (PF) 0.9 % IJ SOLN
INTRAMUSCULAR | Status: DC | PRN
  Administered 2020-03-21: 20 mL

## 2020-03-21 MED ORDER — CARBOXYMETHYLCELLUL-GLYCERIN 0.5-0.9 % OP SOLN: 1.0000 [drp] | Freq: Every day | OPHTHALMIC | Status: DC | PRN

## 2020-03-21 MED ORDER — PROPOFOL 10 MG/ML IV BOLUS
INTRAVENOUS | Status: DC | PRN
  Administered 2020-03-21: 140 mg via INTRAVENOUS

## 2020-03-21 MED ORDER — SODIUM CHLORIDE 0.9 % IV BOLUS
1000.0000 mL | Freq: Once | INTRAVENOUS | Status: AC
  Administered 2020-03-21: 1000 mL via INTRAVENOUS

## 2020-03-21 MED ORDER — BACITRACIN-NEOMYCIN-POLYMYXIN 400-5-5000 EX OINT: 1.0000 "application " | TOPICAL_OINTMENT | Freq: Three times a day (TID) | CUTANEOUS | Status: DC | PRN

## 2020-03-21 MED ORDER — ONDANSETRON HCL 4 MG/2ML IJ SOLN
4.0000 mg | INTRAMUSCULAR | Status: DC | PRN
  Administered 2020-03-21: 4 mg via INTRAVENOUS
  Filled 2020-03-21: qty 2

## 2020-03-21 MED ORDER — HYDROMORPHONE HCL 1 MG/ML IJ SOLN
0.2500 mg | INTRAMUSCULAR | Status: DC | PRN
  Administered 2020-03-21 (×4): 0.5 mg via INTRAVENOUS

## 2020-03-21 MED ORDER — MIDAZOLAM HCL 2 MG/2ML IJ SOLN
INTRAMUSCULAR | Status: DC | PRN
  Administered 2020-03-21: 2 mg via INTRAVENOUS

## 2020-03-21 MED ORDER — SULFAMETHOXAZOLE-TRIMETHOPRIM 800-160 MG PO TABS: 1.0000 | ORAL_TABLET | Freq: Two times a day (BID) | ORAL | 0 refills | Status: AC

## 2020-03-21 MED ORDER — DEXAMETHASONE SODIUM PHOSPHATE 10 MG/ML IJ SOLN
INTRAMUSCULAR | Status: DC | PRN
  Administered 2020-03-21: 8 mg via INTRAVENOUS

## 2020-03-21 MED ORDER — CHLORHEXIDINE GLUCONATE 0.12 % MT SOLN
15.0000 mL | Freq: Once | OROMUCOSAL | Status: AC
  Administered 2020-03-21: 15 mL via OROMUCOSAL

## 2020-03-21 MED ORDER — PRAVASTATIN SODIUM 40 MG PO TABS
40.0000 mg | ORAL_TABLET | ORAL | Status: DC
  Administered 2020-03-21: 40 mg via ORAL
  Filled 2020-03-21: qty 1

## 2020-03-21 MED ORDER — ACETAMINOPHEN 325 MG PO TABS: 325.0000 mg | ORAL_TABLET | Freq: Once | ORAL | Status: DC | PRN

## 2020-03-21 MED ORDER — SODIUM CHLORIDE (PF) 0.9 % IJ SOLN
INTRAMUSCULAR | Status: AC
  Filled 2020-03-21: qty 20

## 2020-03-21 MED ORDER — PROMETHAZINE HCL 25 MG/ML IJ SOLN: 6.2500 mg | INTRAMUSCULAR | Status: DC | PRN

## 2020-03-21 MED ORDER — DEXTROSE-NACL 5-0.45 % IV SOLN: INTRAVENOUS | Status: DC

## 2020-03-21 MED ORDER — ACETAMINOPHEN 10 MG/ML IV SOLN: 1000.0000 mg | Freq: Once | INTRAVENOUS | Status: DC | PRN

## 2020-03-21 SURGICAL SUPPLY — 57 items
APPLICATOR COTTON TIP 6 STRL (MISCELLANEOUS) ×2 IMPLANT
APPLICATOR COTTON TIP 6IN STRL (MISCELLANEOUS) ×3
APPLICATOR SURGIFLO ENDO (HEMOSTASIS) IMPLANT
CATH FOLEY 2WAY SLVR  5CC 18FR (CATHETERS) ×2
CATH FOLEY 2WAY SLVR 5CC 18FR (CATHETERS) ×2 IMPLANT
CATH TIEMANN FOLEY 18FR 5CC (CATHETERS) ×3 IMPLANT
CHLORAPREP W/TINT 26 (MISCELLANEOUS) ×3 IMPLANT
CLIP VESOLOCK LG 6/CT PURPLE (CLIP) ×6 IMPLANT
COVER SURGICAL LIGHT HANDLE (MISCELLANEOUS) ×3 IMPLANT
COVER TIP SHEARS 8 DVNC (MISCELLANEOUS) ×2 IMPLANT
COVER TIP SHEARS 8MM DA VINCI (MISCELLANEOUS) ×3
COVER WAND RF STERILE (DRAPES) IMPLANT
CUTTER ECHEON FLEX ENDO 45 340 (ENDOMECHANICALS) ×3 IMPLANT
DECANTER SPIKE VIAL GLASS SM (MISCELLANEOUS) ×3 IMPLANT
DERMABOND ADVANCED (GAUZE/BANDAGES/DRESSINGS)
DERMABOND ADVANCED .7 DNX12 (GAUZE/BANDAGES/DRESSINGS) IMPLANT
DRAIN CHANNEL RND F F (WOUND CARE) IMPLANT
DRAPE ARM DVNC X/XI (DISPOSABLE) ×8 IMPLANT
DRAPE COLUMN DVNC XI (DISPOSABLE) ×2 IMPLANT
DRAPE DA VINCI XI ARM (DISPOSABLE) ×8
DRAPE DA VINCI XI COLUMN (DISPOSABLE) ×1
DRAPE SURG IRRIG POUCH 19X23 (DRAPES) ×3 IMPLANT
DRSG TEGADERM 4X4.75 (GAUZE/BANDAGES/DRESSINGS) ×3 IMPLANT
ELECT REM PT RETURN 15FT ADLT (MISCELLANEOUS) ×3 IMPLANT
GLOVE BIO SURGEON STRL SZ 6.5 (GLOVE) ×3 IMPLANT
GLOVE BIO SURGEON STRL SZ7.5 (GLOVE) ×6 IMPLANT
GOWN STRL REUS W/TWL LRG LVL3 (GOWN DISPOSABLE) ×3 IMPLANT
GOWN STRL REUS W/TWL XL LVL3 (GOWN DISPOSABLE) ×6 IMPLANT
HEMOSTAT SURGICEL 4X8 (HEMOSTASIS) IMPLANT
HOLDER FOLEY CATH W/STRAP (MISCELLANEOUS) ×3 IMPLANT
IRRIG SUCT STRYKERFLOW 2 WTIP (MISCELLANEOUS) ×3
IRRIGATION SUCT STRKRFLW 2 WTP (MISCELLANEOUS) ×2 IMPLANT
IV LACTATED RINGERS 1000ML (IV SOLUTION) ×3 IMPLANT
KIT TURNOVER KIT A (KITS) IMPLANT
NEEDLE INSUFFLATION 14GA 120MM (NEEDLE) ×3 IMPLANT
PACK ROBOT UROLOGY CUSTOM (CUSTOM PROCEDURE TRAY) ×3 IMPLANT
PAD POSITIONING PINK XL (MISCELLANEOUS) ×3 IMPLANT
PENCIL SMOKE EVACUATOR (MISCELLANEOUS) IMPLANT
SEAL CANN UNIV 5-8 DVNC XI (MISCELLANEOUS) ×8 IMPLANT
SEAL XI 5MM-8MM UNIVERSAL (MISCELLANEOUS) ×12
SET TUBE SMOKE EVAC HIGH FLOW (TUBING) ×3 IMPLANT
SOLUTION ELECTROLUBE (MISCELLANEOUS) ×3 IMPLANT
SPONGE LAP 4X18 RFD (DISPOSABLE) ×3 IMPLANT
STAPLE RELOAD 45 GRN (STAPLE) ×2 IMPLANT
STAPLE RELOAD 45MM GREEN (STAPLE) ×2
SURGIFLO W/THROMBIN 8M KIT (HEMOSTASIS) IMPLANT
SUT ETHILON 3 0 PS 1 (SUTURE) ×3 IMPLANT
SUT MNCRL AB 4-0 PS2 18 (SUTURE) ×6 IMPLANT
SUT VIC AB 0 UR5 27 (SUTURE) ×3 IMPLANT
SUT VIC AB 2-0 SH 27 (SUTURE) ×4
SUT VIC AB 2-0 SH 27X BRD (SUTURE) ×4 IMPLANT
SUT VICRYL 0 UR6 27IN ABS (SUTURE) ×6 IMPLANT
SUT VLOC BARB 180 ABS3/0GR12 (SUTURE) ×6
SUTURE VLOC BRB 180 ABS3/0GR12 (SUTURE) ×4 IMPLANT
TOWEL OR NON WOVEN STRL DISP B (DISPOSABLE) ×3 IMPLANT
TROCAR XCEL NON-BLD 5MMX100MML (ENDOMECHANICALS) IMPLANT
WATER STERILE IRR 1000ML POUR (IV SOLUTION) ×3 IMPLANT

## 2020-03-21 NOTE — Op Note (Signed)
Operative Note  Preoperative diagnosis:  1.  Prostate cancer   Postoperative diagnosis: 1.  Prostate cancer  Procedure(s): 1.  Robotic assisted laparoscopic prostatectomy with bilateral pelvic lymph node dissection  Surgeon: Link Snuffer, MD  Assistants: Estill Bamberg dancy--an assistant was needed due to the nature of the case being a robotic surgery requiring a bedside assistant for retraction, suctioning, exchanging instruments, etc.   Resident: Carmie Kanner  Anesthesia: General   Complications: None   EBL: 100 cc   Specimens: 1. prostate and seminal vesicles 2.  Periprosthetic fat 3.  Right pelvic lymph node packet 4.  Left pelvic lymph node packet  Drains/Catheters: 1. 28 French Foley catheter and JP drain   Intraoperative findings: prostate and seminal vesicles were removed en bloc Without any evidence of gross extraprostatic disease  Indication: 59 year old male with a recent diagnosis of prostate cancer presents for the previously mentioned operation  Description of procedure:  The patient was identified and consent was obtained.  The patient was taken to the operating room and placed in the supine position.  The patient was placed under general anesthesia.  Perioperative antibiotics were administered.  The patient was placed in dorsal lithotomy.  Patient was prepped and draped in a standard sterile fashion and a timeout was performed.  The patient was placed in steep Trendelenburg.  Foley catheter was placed.  Veress needle was inserted supraumbilical and the drop test was performed with no evidence of any injury.  The abdomen was then insufflated to a pressure of 15.  4 robotic working ports, a 12 mm assistant port, and a 5 mm assistant port were placed under direct visualization in a standard fashion for a robotic pelvic surgery.  The abdomen was inspected and there was no evidence of any visceral or vascular injury.  I first released colonic adhesions in the left  lower quadrant.  I then retracted the sigmoid and rectum superiorly.  I first started with the posterior dissection by incising along the posterior peritoneum and identifying bilateral vas deferens.  These were divided and bilateral seminal vesicles were carefully dissected out.  Denonvier fascia was then entered posteriorly.  The bladder was then dropped by incising along the medial umbilical ligament bilaterally.  Periprosthetic fat was cleared off the prostate and passed off and passed off for specimen.  The endopelvic fascia was incised bilaterally and the puboprostatic ligaments were released.  A stapler with a vascular staple load was used to staple the dorsal venous complex.  I then incised along into the bladder neck and divided the bladder neck.  The catheter was brought out and used for retraction.  I divided the remainder of the bladder neck and then pulled the seminal vesicles out of this incision.  Careful dissection was performed and bilateral prostatic pedicles were released using Hem-o-lok clips and sharp dissection.  Spot cautery was sparingly used.  Periprosthetic tissue was carefully released inferiorly and laterally, performing a nerve sparing operation bilaterally.  Once only the urethra remained attached, the anterior portion of the urethra was divided.  A 2-0 Vicryl stitch was placed at the 6 o'clock position.  The remainder of the urethra was divided and the prostate was placed in a specimen bag.  FloSeal was applied to the prostatectomy bed.  I then performed the bilateral pelvic lymph node dissection.  I first carefully removed lymph node packet from the right side overlying the external iliac artery and vein down to the level of the obturator nerve.  This was passed off  for specimen and then I carefully removed the left sided pelvic lymph node packets again overlying the external iliac artery and vein down to the level of the obturator nerve taking care not to injure any vital  structures.  The 2-0 Vicryl stitch was used to reapproximate the bladder and urethra at the 6 o'clock position.  A running 3-0 V lock suture was then used to reapproximate the bladder and urethra.  The bladder was filled with normal saline and there was no evidence of any leak at the anastomosis.  The anastomosis was watertight.  The Foley catheter was exchanged for a fresh catheter.  A JP drain was inserted from the left lateral port site.  This was secured down with a nylon stitch.  The robot was undocked and the patient was taken out of Trendelenburg.  All working ports were removed under direct visualization with the camera to ensure there was no bleeding from the sites.  The midline port incision was extended and the prostate extracted in the specimen bag.  Interrupted figure-of-eight 0 Vicryl sutures were used to close  the fascia.  The 12 mm assistant port fascia was also closed with a 2-0 Vicryl.  Skin was then closed with 4-0 Monocryl and Dermabond.  Exparel was used for anesthetic effect.  This concluded the operation.  The patient tolerated procedure well and was stable postoperatively.  Plan: Will obtain stat labs.  He will remain in the hospital overnight and hopefully be able to be discharged tomorrow.  He will keep his catheter for 7-10 days.

## 2020-03-21 NOTE — Anesthesia Postprocedure Evaluation (Signed)
Anesthesia Post Note  Patient: SHANAN MCMILLER  Procedure(s) Performed: XI ROBOTIC ASSISTED LAPAROSCOPIC RADICAL PROSTATECTOMY (N/A ) BILATERAL PELVIC LYMPH NODE DISSECTION (Bilateral )     Patient location during evaluation: PACU Anesthesia Type: General Level of consciousness: awake and alert Pain management: pain level controlled Vital Signs Assessment: post-procedure vital signs reviewed and stable Respiratory status: spontaneous breathing, nonlabored ventilation, respiratory function stable and patient connected to nasal cannula oxygen Cardiovascular status: blood pressure returned to baseline and stable Postop Assessment: no apparent nausea or vomiting Anesthetic complications: no   No complications documented.  Last Vitals:  Vitals:   03/21/20 1130 03/21/20 1214  BP: (!) 141/69 (!) 148/79  Pulse: 65 74  Resp: 16 18  Temp:  36.6 C  SpO2: 100% 100%    Last Pain:  Vitals:   03/21/20 1214  TempSrc: Oral  PainSc: 5                  Effie Berkshire

## 2020-03-21 NOTE — Progress Notes (Signed)
Post-op note  Subjective: The patient is doing well.  Mild right sided abdominal pain. Plans to ambulate later this evening. Tolerating clears.   Objective: Vital signs in last 24 hours: Temp:  [97.7 F (36.5 C)-98.7 F (37.1 C)] 98.2 F (36.8 C) (07/26 1517) Pulse Rate:  [58-76] 69 (07/26 1517) Resp:  [12-23] 18 (07/26 1517) BP: (125-148)/(66-94) 125/75 (07/26 1517) SpO2:  [94 %-100 %] 94 % (07/26 1517) Weight:  [84.4 kg] 84.4 kg (07/26 1217)  Intake/Output from previous day: No intake/output data recorded. Intake/Output this shift: Total I/O In: 2875.7 [P.O.:120; I.V.:1455.7; IV Piggyback:1300] Out: 325 [Urine:250; Drains:50; Blood:25]  Physical Exam:  General: Alert and oriented. Abdomen: Soft, Nondistended. JP serosanguinous Incisions: Clean and dry. Urine: clear yellow  Lab Results: Recent Labs    03/21/20 1127  HGB 15.3  HCT 44.9    Assessment/Plan: POD#0 robotic assisted prostatectomy  1) Continue to foley catheter to drainage  2) DVT prophy, clears, IS, amb, pain control   LOS: 0 days   Carmie Kanner 03/21/2020, 3:49 PM

## 2020-03-21 NOTE — Discharge Instructions (Signed)

## 2020-03-21 NOTE — Anesthesia Procedure Notes (Signed)
Procedure Name: Intubation Date/Time: 03/21/2020 7:40 AM Performed by: Niel Hummer, CRNA Pre-anesthesia Checklist: Patient identified, Emergency Drugs available, Suction available and Patient being monitored Patient Re-evaluated:Patient Re-evaluated prior to induction Oxygen Delivery Method: Circle system utilized Preoxygenation: Pre-oxygenation with 100% oxygen Induction Type: IV induction Ventilation: Mask ventilation without difficulty Laryngoscope Size: Mac and 4 Grade View: Grade I Tube type: Oral Tube size: 7.0 mm Number of attempts: 1 Airway Equipment and Method: Stylet Placement Confirmation: ETT inserted through vocal cords under direct vision,  positive ETCO2 and breath sounds checked- equal and bilateral Secured at: 23 cm Tube secured with: Tape Dental Injury: Teeth and Oropharynx as per pre-operative assessment  Comments: DL x1 by EMT student. Grade 1 view. Passed ETT easily.

## 2020-03-21 NOTE — Anesthesia Preprocedure Evaluation (Addendum)
Anesthesia Evaluation  Patient identified by MRN, date of birth, ID band Patient awake    Reviewed: Allergy & Precautions, NPO status , Patient's Chart, lab work & pertinent test results  Airway Mallampati: I  TM Distance: >3 FB Neck ROM: Full    Dental  (+) Teeth Intact, Dental Advisory Given   Pulmonary neg pulmonary ROS,    breath sounds clear to auscultation       Cardiovascular hypertension, Pt. on medications  Rhythm:Regular Rate:Normal     Neuro/Psych  Headaches, negative psych ROS   GI/Hepatic negative GI ROS, Neg liver ROS,   Endo/Other  negative endocrine ROS  Renal/GU negative Renal ROS     Musculoskeletal  (+) Arthritis ,   Abdominal Normal abdominal exam  (+)   Peds  Hematology   Anesthesia Other Findings   Reproductive/Obstetrics                            Anesthesia Physical Anesthesia Plan  ASA: II  Anesthesia Plan: General   Post-op Pain Management:    Induction: Intravenous  PONV Risk Score and Plan: 3 and Ondansetron, Dexamethasone and Midazolam  Airway Management Planned: Oral ETT  Additional Equipment: None  Intra-op Plan:   Post-operative Plan: Extubation in OR  Informed Consent: I have reviewed the patients History and Physical, chart, labs and discussed the procedure including the risks, benefits and alternatives for the proposed anesthesia with the patient or authorized representative who has indicated his/her understanding and acceptance.     Dental advisory given  Plan Discussed with: CRNA  Anesthesia Plan Comments: (2 IV's)       Anesthesia Quick Evaluation

## 2020-03-21 NOTE — H&P (Signed)
CC/HPI: CC: Prostate cancer  HPI:  Patient was referred for elevated PSA. PSA on 11/10/2019 was 7.4, 14.3% free. He will 6 on 04/10/2019 and 5.8 on 10/24/2018. He has minimal voiding complaints. He has no complaints today. No history of prostatitis. No family history of prostate cancer.   01/14/2020  Patient doing well since prostate biopsy. Prostate size was 30 g. 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 Cancer involved 9/12 cores with 1 core of atypia. He has a history of laparoscopic cholecystectomy but otherwise no abdominal surgeries. He denies erectile dysfunction or significant lower urinary tract symptoms.   02/19/2020  Patient has elected to undergo robotic assisted laparoscopic prostatectomy with bilateral pelvic lymph node dissection. He needs to be set for physical therapy.     CC: AUA Questions Scoring.  HPI: Adam Cooley is a 59 year-old male established patient who is here AUA Questions.      AUA Symptom Score: He never has the sensation of not emptying his bladder completely after finishing urinating. Less than 20% of the time he has to urinate again fewer than two hours after he has finished urinating. He does not have to stop and start again several times when he urinates. Almost always he finds it difficult to postpone urination. 50% of the time he has a weak urinary stream. He never has to push or strain to begin urination. He has to get up to urinate 2 times from the time he goes to bed until the time he gets up in the morning.   Calculated AUA Symptom Score: 11    ALLERGIES: Erythromycin - pt says swells up if he hits medhimself after having had Keflex - Itching, Swelling, Hives Penicillin    MEDICATIONS: Hydrochlorothiazide  Allegra Allergy  Ezetimibe  Levofloxacin 750 mg tablet take 1 tablet morning of procedure  Pravastatin Sodium     GU PSH: Prostate Needle Biopsy - 01/08/2020     NON-GU PSH: Back  surgery Cholecystectomy (laparoscopic) Surgical Pathology, Gross And Microscopic Examination For Prostate Needle - 01/08/2020     GU PMH: Prostate Cancer - 01/14/2020 BPH w/o LUTS - 12/11/2019 Elevated PSA - 12/11/2019 Encounter for Prostate Cancer screening - 12/11/2019    NON-GU PMH: Arthritis Hypertension    FAMILY HISTORY: 1 Daughter - Other Kidney Failure - Father Lung Cancer - Mother   SOCIAL HISTORY: Marital Status: Divorced Preferred Language: English; Race: White Current Smoking Status: Patient has never smoked.   Tobacco Use Assessment Completed: Used Tobacco in last 30 days? Drinks 4+ caffeinated drinks per day.    REVIEW OF SYSTEMS:    GU Review Male:   Patient denies frequent urination, hard to postpone urination, burning/ pain with urination, get up at night to urinate, leakage of urine, stream starts and stops, trouble starting your stream, have to strain to urinate , erection problems, and penile pain.  Gastrointestinal (Upper):   Patient denies nausea, vomiting, and indigestion/ heartburn.  Gastrointestinal (Lower):   Patient denies diarrhea and constipation.  Constitutional:   Patient denies fever, night sweats, weight loss, and fatigue.  Skin:   Patient denies skin rash/ lesion and itching.  Eyes:   Patient denies blurred vision and double vision.  Ears/ Nose/ Throat:   Patient denies sore throat and sinus problems.  Hematologic/Lymphatic:   Patient denies swollen glands and easy bruising.  Cardiovascular:   Patient denies leg swelling and chest pains.  Respiratory:   Patient denies cough and shortness of  breath.  Endocrine:   Patient denies excessive thirst.  Musculoskeletal:   Patient denies back pain and joint pain.  Neurological:   Patient denies headaches and dizziness.  Psychologic:   Patient denies depression and anxiety.   VITAL SIGNS:      02/19/2020 08:01 AM  Weight 188 lb / 85.28 kg  Height 68 in / 172.72 cm  BP 151/85 mmHg  Heart Rate 55 /min   BMI 28.6 kg/m   Complexity of Data:  Lab Test Review:   PSA  Records Review:   AUA Symptom Score, Pathology Reports, Previous Patient Records  Urine Test Review:   Urinalysis   PROCEDURES:          Urinalysis w/Scope Dipstick Dipstick Cont'd Micro  Color: Amber Bilirubin: Neg mg/dL WBC/hpf: 0 - 5/hpf  Appearance: Clear Ketones: Neg mg/dL RBC/hpf: 3 - 10/hpf  Specific Gravity: 1.020 Blood: Trace ery/uL Bacteria: Rare (0-9/hpf)  pH: 6.5 Protein: Neg mg/dL Cystals: NS (Not Seen)  Glucose: Neg mg/dL Urobilinogen: 0.2 mg/dL Casts: NS (Not Seen)    Nitrites: Neg Trichomonas: Not Present    Leukocyte Esterase: Neg leu/uL Mucous: Present      Epithelial Cells: 0 - 5/hpf      Yeast: NS (Not Seen)      Sperm: Not Present    ASSESSMENT:      ICD-10 Details  1 GU:   Prostate Cancer - C61 Chronic, Stable     PLAN:            Medications Stop Meds: Levofloxacin 750 mg tablet take 1 tablet morning of procedure  Start: 12/11/2019  Discontinue: 02/19/2020  - Reason: The medication cycle was completed.            Schedule Return Visit/Planned Activity: Next Available Appointment - PT Referral  Procedure: Unspecified Date - Robotic Radical Prostatectomy - [phone]          Document Letter(s):  Created for Patient: Clinical Summary         Notes:   Proceed with robotic assisted laparoscopic prostatectomy with bilateral pelvic lymph node dissection. He understands potential for bleeding requiring blood transfusion, infection, injury to surrounding structures including nerves erectile injury or major vascular injury, long-term erectile dysfunction and long-term urinary incontinence.   We will get him set up with physical therapy.   Cc: Dr. Alroy Dust        Next Appointment:      Next Appointment: 03/21/2020 07:30 AM    Appointment Type: Surgery     Location: [facility] [phone]    Provider: Link Snuffer, III, M.D.    Reason for Visit: OBS WL RALP WITH BIL PLND      Signed by Link Snuffer, III, M.D. on 02/19/20 at 8:24 AM (EDT

## 2020-03-21 NOTE — Transfer of Care (Signed)
Immediate Anesthesia Transfer of Care Note  Patient: Adam Cooley  Procedure(s) Performed: XI ROBOTIC ASSISTED LAPAROSCOPIC RADICAL PROSTATECTOMY (N/A ) BILATERAL PELVIC LYMPH NODE DISSECTION (Bilateral )  Patient Location: PACU  Anesthesia Type:General  Level of Consciousness: awake, drowsy, patient cooperative and responds to stimulation  Airway & Oxygen Therapy: Patient Spontanous Breathing and Patient connected to face mask oxygen  Post-op Assessment: Report given to RN and Post -op Vital signs reviewed and stable  Post vital signs: Reviewed and stable  Last Vitals:  Vitals Value Taken Time  BP 136/66 03/21/20 1049  Temp    Pulse 70 03/21/20 1051  Resp 23 03/21/20 1051  SpO2 96 % 03/21/20 1051  Vitals shown include unvalidated device data.  Last Pain:  Vitals:   03/21/20 0610  TempSrc:   PainSc: 0-No pain         Complications: No complications documented.

## 2020-03-22 ENCOUNTER — Encounter (HOSPITAL_COMMUNITY): Payer: Self-pay | Admitting: Urology

## 2020-03-22 LAB — BASIC METABOLIC PANEL
Anion gap: 12 (ref 5–15)
BUN: 11 mg/dL (ref 8–23)
CO2: 27 mmol/L (ref 22–32)
Calcium: 8.9 mg/dL (ref 8.9–10.3)
Chloride: 99 mmol/L (ref 98–111)
Creatinine, Ser: 0.91 mg/dL (ref 0.61–1.24)
GFR calc Af Amer: >60 mL/min (ref >60)
GFR calc non Af Amer: >60 mL/min (ref >60)
Glucose, Bld: 139 mg/dL — ABNORMAL HIGH (ref 70–99)
Potassium: 3.3 mmol/L — ABNORMAL LOW (ref 3.5–5.1)
Sodium: 138 mmol/L (ref 135–145)

## 2020-03-22 LAB — CREATININE, FLUID (PLEURAL, PERITONEAL, JP DRAINAGE): Creat, Fluid: 0.8 mg/dL

## 2020-03-22 LAB — HEMOGLOBIN AND HEMATOCRIT, BLOOD
HCT: 43 % (ref 39.0–52.0)
Hemoglobin: 14.5 g/dL (ref 13.0–17.0)

## 2020-03-22 MED ORDER — OXYBUTYNIN CHLORIDE 5 MG PO TABS
5.0000 mg | ORAL_TABLET | Freq: Three times a day (TID) | ORAL | Status: DC | PRN
  Administered 2020-03-22: 5 mg via ORAL
  Filled 2020-03-22: qty 1

## 2020-03-22 MED ORDER — OXYBUTYNIN CHLORIDE 5 MG PO TABS: 5.0000 mg | ORAL_TABLET | Freq: Three times a day (TID) | ORAL | 0 refills | Status: AC | PRN

## 2020-03-22 NOTE — Discharge Summary (Signed)
Date of admission: 03/21/2020  Date of discharge: 03/22/2020  Admission diagnosis: Prostate Cancer  Discharge diagnosis: Prostate Cancer  History and Physical: For full details, please see admission history and physical. Briefly, Adam Cooley is a 59 y.o. gentleman with localized prostate cancer.  After discussing management/treatment options, he elected to proceed with surgical treatment.  Hospital Course: Adam Cooley was taken to the operating room on 03/21/2020 and underwent a robotic assisted laparoscopic radical prostatectomy. He tolerated this procedure well and without complications. Postoperatively, he was able to be transferred to a regular hospital room following recovery from anesthesia.  He was able to begin ambulating the night of surgery. He remained hemodynamically stable overnight.  He had excellent urine output with appropriately minimal output from his pelvic drain and his pelvic drain was removed on POD #1. Drain creatinine was 0.8. He was transitioned to oral pain medication, tolerated a clear liquid diet, and had met all discharge criteria and was able to be discharged home later on POD#1.  Laboratory values:  Recent Labs    03/21/20 1127 03/22/20 0528  HGB 15.3 14.5  HCT 44.9 43.0    Disposition: Home  Discharge instruction: He was instructed to be ambulatory but to refrain from heavy lifting, strenuous activity, or driving. He was instructed on urethral catheter care.  Discharge medications:   Allergies as of 03/22/2020      Reactions   Erythromycin Itching, Swelling   Keflex [cephalexin] Itching, Swelling   Penicillins    Did it involve swelling of the face/tongue/throat, SOB, or low BP? Unknown Did it involve sudden or severe rash/hives, skin peeling, or any reaction on the inside of your mouth or nose? Unknown Did you need to seek medical attention at a hospital or doctor's office? Unknown When did it last happen? Childhood allergy If all above answers  are "NO", may proceed with cephalosporin use.      Medication List    STOP taking these medications   ibuprofen 200 MG tablet Commonly known as: ADVIL   VITAMIN C PO     TAKE these medications   acetaminophen 500 MG tablet Commonly known as: TYLENOL Take 1,000 mg by mouth every 6 (six) hours as needed for moderate pain or headache.   ezetimibe 10 MG tablet Commonly known as: ZETIA Take 10 mg by mouth every evening.   fexofenadine 180 MG tablet Commonly known as: ALLEGRA Take 180 mg by mouth daily as needed for allergies or rhinitis.   hydrochlorothiazide 25 MG tablet Commonly known as: HYDRODIURIL Take 25 mg by mouth every evening.   HYDROcodone-acetaminophen 5-325 MG tablet Commonly known as: NORCO/VICODIN Take 1-2 tablets by mouth every 6 (six) hours as needed for moderate pain. What changed: how much to take   LUBRICATING EYE DROPS OP Place 1 drop into both eyes daily as needed (dry eyes).   oxybutynin 5 MG tablet Commonly known as: DITROPAN Take 1 tablet (5 mg total) by mouth every 8 (eight) hours as needed for bladder spasms.   pravastatin 40 MG tablet Commonly known as: PRAVACHOL Take 40 mg by mouth every Monday, Wednesday, and Friday.   sulfamethoxazole-trimethoprim 800-160 MG tablet Commonly known as: BACTRIM DS Take 1 tablet by mouth 2 (two) times daily. Start the day prior to foley removal appointment       Followup: He will followup in 1 week for catheter removal and to discuss his surgical pathology results.

## 2020-03-22 NOTE — Plan of Care (Signed)
  Problem: Education: Goal: Knowledge of the procedure and recovery process will improve Outcome: Progressing   Problem: Bowel/Gastric: Goal: Gastrointestinal status for postoperative course will improve Outcome: Progressing   Problem: Pain Management: Goal: General experience of comfort will improve Outcome: Progressing   Problem: Skin Integrity: Goal: Demonstration of wound healing without infection will improve Outcome: Progressing   Problem: Urinary Elimination: Goal: Ability to avoid or minimize complications of infection will improve Outcome: Progressing Goal: Ability to achieve and maintain urine output will improve Outcome: Progressing Goal: Home care management will improve Outcome: Progressing

## 2020-03-22 NOTE — Progress Notes (Signed)
1 Day Post-Op Subjective: The patient is doing well. Ambulated 3 times yesterday No nausea or vomiting. Pain is adequately controlled.  Objective: Vital signs in last 24 hours: Temp:  [97.8 F (36.6 C)-99.3 F (37.4 C)] 99.3 F (37.4 C) (07/27 1030) Pulse Rate:  [65-76] 70 (07/27 1030) Resp:  [18-19] 18 (07/27 1030) BP: (125-166)/(70-84) 166/82 (07/27 1030) SpO2:  [94 %-100 %] 98 % (07/27 1030) Weight:  [84.4 kg] 84.4 kg (07/26 1217)  Intake/Output from previous day: 07/26 0701 - 07/27 0700 In: 5519 [P.O.:1080; I.V.:3139; IV Piggyback:1300] Out: 3760 [Urine:3425; Drains:310; Blood:25] Intake/Output this shift: Total I/O In: 258.3 [I.V.:258.3] Out: 40 [Drains:40]  Physical Exam:  General: Alert and oriented. CV: RRR Lungs: Clear bilaterally. GI: Soft, Nondistended. Incisions: Clean, dry, and intact. JP drain SS output Urine: Clear Extremities: Nontender, no erythema, no edema.  Lab Results: Recent Labs    03/21/20 1127 03/22/20 0528  HGB 15.3 14.5  HCT 44.9 43.0      Assessment/Plan: POD# 1 s/p robotic prostatectomy.  1) SL IVF 2) Ambulate, Incentive spirometry 3) Transition to oral pain medication 4) Continue clears 5) D/C pelvic drain if JP creatinine normal 6) Plan for likely discharge later today     LOS: 0 days   Carmie Kanner 03/22/2020, 11:51 AM

## 2020-03-25 LAB — SURGICAL PATHOLOGY

## 2020-04-19 DIAGNOSIS — M6281 Muscle weakness (generalized): Secondary | ICD-10-CM | POA: Diagnosis not present

## 2020-04-19 DIAGNOSIS — N3946 Mixed incontinence: Secondary | ICD-10-CM | POA: Diagnosis not present

## 2020-04-19 DIAGNOSIS — R350 Frequency of micturition: Secondary | ICD-10-CM | POA: Diagnosis not present

## 2020-04-19 DIAGNOSIS — N5231 Erectile dysfunction following radical prostatectomy: Secondary | ICD-10-CM | POA: Diagnosis not present

## 2020-05-05 DIAGNOSIS — C61 Malignant neoplasm of prostate: Secondary | ICD-10-CM | POA: Diagnosis not present

## 2020-05-10 DIAGNOSIS — N393 Stress incontinence (female) (male): Secondary | ICD-10-CM | POA: Diagnosis not present

## 2020-05-11 DIAGNOSIS — Z23 Encounter for immunization: Secondary | ICD-10-CM | POA: Diagnosis not present

## 2020-05-11 DIAGNOSIS — E7800 Pure hypercholesterolemia, unspecified: Secondary | ICD-10-CM | POA: Diagnosis not present

## 2020-05-11 DIAGNOSIS — I10 Essential (primary) hypertension: Secondary | ICD-10-CM | POA: Diagnosis not present

## 2020-05-12 DIAGNOSIS — N393 Stress incontinence (female) (male): Secondary | ICD-10-CM | POA: Diagnosis not present

## 2020-05-12 DIAGNOSIS — M6281 Muscle weakness (generalized): Secondary | ICD-10-CM | POA: Diagnosis not present

## 2020-05-12 DIAGNOSIS — R350 Frequency of micturition: Secondary | ICD-10-CM | POA: Diagnosis not present

## 2020-05-20 DIAGNOSIS — H524 Presbyopia: Secondary | ICD-10-CM | POA: Diagnosis not present

## 2020-05-20 DIAGNOSIS — H52223 Regular astigmatism, bilateral: Secondary | ICD-10-CM | POA: Diagnosis not present

## 2020-05-20 DIAGNOSIS — H5203 Hypermetropia, bilateral: Secondary | ICD-10-CM | POA: Diagnosis not present

## 2020-06-07 DIAGNOSIS — N3946 Mixed incontinence: Secondary | ICD-10-CM | POA: Diagnosis not present

## 2020-06-07 DIAGNOSIS — M6281 Muscle weakness (generalized): Secondary | ICD-10-CM | POA: Diagnosis not present

## 2020-06-07 DIAGNOSIS — N393 Stress incontinence (female) (male): Secondary | ICD-10-CM | POA: Diagnosis not present

## 2020-08-09 DIAGNOSIS — C61 Malignant neoplasm of prostate: Secondary | ICD-10-CM | POA: Diagnosis not present

## 2020-08-09 DIAGNOSIS — N393 Stress incontinence (female) (male): Secondary | ICD-10-CM | POA: Diagnosis not present

## 2020-08-09 DIAGNOSIS — R3915 Urgency of urination: Secondary | ICD-10-CM | POA: Diagnosis not present

## 2020-08-09 DIAGNOSIS — N5231 Erectile dysfunction following radical prostatectomy: Secondary | ICD-10-CM | POA: Diagnosis not present

## 2020-11-02 DIAGNOSIS — C61 Malignant neoplasm of prostate: Secondary | ICD-10-CM | POA: Diagnosis not present

## 2020-11-09 DIAGNOSIS — I10 Essential (primary) hypertension: Secondary | ICD-10-CM | POA: Diagnosis not present

## 2020-11-09 DIAGNOSIS — E7800 Pure hypercholesterolemia, unspecified: Secondary | ICD-10-CM | POA: Diagnosis not present

## 2020-11-09 DIAGNOSIS — Z0000 Encounter for general adult medical examination without abnormal findings: Secondary | ICD-10-CM | POA: Diagnosis not present

## 2020-11-10 DIAGNOSIS — C61 Malignant neoplasm of prostate: Secondary | ICD-10-CM | POA: Diagnosis not present

## 2020-11-10 DIAGNOSIS — N5231 Erectile dysfunction following radical prostatectomy: Secondary | ICD-10-CM | POA: Diagnosis not present

## 2020-11-10 DIAGNOSIS — N393 Stress incontinence (female) (male): Secondary | ICD-10-CM | POA: Diagnosis not present

## 2021-02-01 DIAGNOSIS — C61 Malignant neoplasm of prostate: Secondary | ICD-10-CM | POA: Diagnosis not present

## 2021-02-08 DIAGNOSIS — C61 Malignant neoplasm of prostate: Secondary | ICD-10-CM | POA: Diagnosis not present

## 2021-05-11 DIAGNOSIS — C61 Malignant neoplasm of prostate: Secondary | ICD-10-CM | POA: Diagnosis not present

## 2021-05-17 DIAGNOSIS — E7800 Pure hypercholesterolemia, unspecified: Secondary | ICD-10-CM | POA: Diagnosis not present

## 2021-05-17 DIAGNOSIS — I10 Essential (primary) hypertension: Secondary | ICD-10-CM | POA: Diagnosis not present

## 2021-05-17 DIAGNOSIS — M79644 Pain in right finger(s): Secondary | ICD-10-CM | POA: Diagnosis not present

## 2021-05-18 DIAGNOSIS — C61 Malignant neoplasm of prostate: Secondary | ICD-10-CM | POA: Diagnosis not present

## 2021-05-18 DIAGNOSIS — N393 Stress incontinence (female) (male): Secondary | ICD-10-CM | POA: Diagnosis not present

## 2021-05-18 DIAGNOSIS — N5231 Erectile dysfunction following radical prostatectomy: Secondary | ICD-10-CM | POA: Diagnosis not present

## 2021-08-14 DIAGNOSIS — C61 Malignant neoplasm of prostate: Secondary | ICD-10-CM | POA: Diagnosis not present

## 2021-08-14 DIAGNOSIS — N393 Stress incontinence (female) (male): Secondary | ICD-10-CM | POA: Diagnosis not present

## 2021-09-20 DIAGNOSIS — M25562 Pain in left knee: Secondary | ICD-10-CM | POA: Diagnosis not present

## 2021-09-26 DIAGNOSIS — H524 Presbyopia: Secondary | ICD-10-CM | POA: Diagnosis not present

## 2021-09-26 DIAGNOSIS — H16223 Keratoconjunctivitis sicca, not specified as Sjogren's, bilateral: Secondary | ICD-10-CM | POA: Diagnosis not present

## 2021-09-26 DIAGNOSIS — H5203 Hypermetropia, bilateral: Secondary | ICD-10-CM | POA: Diagnosis not present

## 2021-09-26 DIAGNOSIS — H52223 Regular astigmatism, bilateral: Secondary | ICD-10-CM | POA: Diagnosis not present

## 2021-09-27 ENCOUNTER — Other Ambulatory Visit: Payer: Self-pay | Admitting: Sports Medicine

## 2021-09-27 ENCOUNTER — Ambulatory Visit
Admission: RE | Admit: 2021-09-27 | Discharge: 2021-09-27 | Disposition: A | Discharge status: Home / Self Care | Payer: BC Managed Care – PPO | Source: Ambulatory Visit | Attending: Sports Medicine | Admitting: Sports Medicine

## 2021-09-27 DIAGNOSIS — M25562 Pain in left knee: Secondary | ICD-10-CM

## 2021-10-18 DIAGNOSIS — M25561 Pain in right knee: Secondary | ICD-10-CM | POA: Diagnosis not present

## 2021-11-16 DIAGNOSIS — C61 Malignant neoplasm of prostate: Secondary | ICD-10-CM | POA: Diagnosis not present

## 2021-11-23 DIAGNOSIS — C61 Malignant neoplasm of prostate: Secondary | ICD-10-CM | POA: Diagnosis not present

## 2021-11-23 DIAGNOSIS — N393 Stress incontinence (female) (male): Secondary | ICD-10-CM | POA: Diagnosis not present

## 2021-11-28 DIAGNOSIS — E7800 Pure hypercholesterolemia, unspecified: Secondary | ICD-10-CM | POA: Diagnosis not present

## 2021-11-28 DIAGNOSIS — I10 Essential (primary) hypertension: Secondary | ICD-10-CM | POA: Diagnosis not present

## 2021-11-28 DIAGNOSIS — Z0000 Encounter for general adult medical examination without abnormal findings: Secondary | ICD-10-CM | POA: Diagnosis not present

## 2021-12-27 DIAGNOSIS — R5383 Other fatigue: Secondary | ICD-10-CM | POA: Diagnosis not present

## 2021-12-27 DIAGNOSIS — F411 Generalized anxiety disorder: Secondary | ICD-10-CM | POA: Diagnosis not present

## 2022-02-19 DIAGNOSIS — C61 Malignant neoplasm of prostate: Secondary | ICD-10-CM | POA: Diagnosis not present

## 2022-02-26 DIAGNOSIS — C61 Malignant neoplasm of prostate: Secondary | ICD-10-CM | POA: Diagnosis not present

## 2022-02-26 DIAGNOSIS — N393 Stress incontinence (female) (male): Secondary | ICD-10-CM | POA: Diagnosis not present

## 2022-06-08 DIAGNOSIS — E7800 Pure hypercholesterolemia, unspecified: Secondary | ICD-10-CM | POA: Diagnosis not present

## 2022-06-08 DIAGNOSIS — I10 Essential (primary) hypertension: Secondary | ICD-10-CM | POA: Diagnosis not present

## 2022-06-08 DIAGNOSIS — J309 Allergic rhinitis, unspecified: Secondary | ICD-10-CM | POA: Diagnosis not present

## 2022-08-30 DIAGNOSIS — C61 Malignant neoplasm of prostate: Secondary | ICD-10-CM | POA: Diagnosis not present

## 2022-09-06 DIAGNOSIS — N5231 Erectile dysfunction following radical prostatectomy: Secondary | ICD-10-CM | POA: Diagnosis not present

## 2022-09-06 DIAGNOSIS — N393 Stress incontinence (female) (male): Secondary | ICD-10-CM | POA: Diagnosis not present

## 2022-09-06 DIAGNOSIS — C61 Malignant neoplasm of prostate: Secondary | ICD-10-CM | POA: Diagnosis not present

## 2022-10-02 DIAGNOSIS — H524 Presbyopia: Secondary | ICD-10-CM | POA: Diagnosis not present

## 2022-10-02 DIAGNOSIS — H5203 Hypermetropia, bilateral: Secondary | ICD-10-CM | POA: Diagnosis not present

## 2022-10-02 DIAGNOSIS — H52223 Regular astigmatism, bilateral: Secondary | ICD-10-CM | POA: Diagnosis not present

## 2022-10-10 DIAGNOSIS — S0993XA Unspecified injury of face, initial encounter: Secondary | ICD-10-CM | POA: Diagnosis not present

## 2022-10-10 DIAGNOSIS — R220 Localized swelling, mass and lump, head: Secondary | ICD-10-CM | POA: Diagnosis not present

## 2022-12-06 DIAGNOSIS — I10 Essential (primary) hypertension: Secondary | ICD-10-CM | POA: Diagnosis not present

## 2022-12-06 DIAGNOSIS — E7800 Pure hypercholesterolemia, unspecified: Secondary | ICD-10-CM | POA: Diagnosis not present

## 2022-12-06 DIAGNOSIS — Z0000 Encounter for general adult medical examination without abnormal findings: Secondary | ICD-10-CM | POA: Diagnosis not present

## 2023-02-27 DIAGNOSIS — C61 Malignant neoplasm of prostate: Secondary | ICD-10-CM | POA: Diagnosis not present

## 2023-03-07 DIAGNOSIS — C61 Malignant neoplasm of prostate: Secondary | ICD-10-CM | POA: Diagnosis not present

## 2023-03-07 DIAGNOSIS — N393 Stress incontinence (female) (male): Secondary | ICD-10-CM | POA: Diagnosis not present

## 2023-05-28 DIAGNOSIS — I10 Essential (primary) hypertension: Secondary | ICD-10-CM | POA: Diagnosis not present

## 2023-05-28 DIAGNOSIS — E7800 Pure hypercholesterolemia, unspecified: Secondary | ICD-10-CM | POA: Diagnosis not present

## 2023-08-14 IMAGING — CR DG KNEE 3 VIEWS*L*
3 series · 3 of 3 positions shown · non-contrast
Comparison: None.

CLINICAL DATA: Chronic left knee pain medially for approximately 6
months common no known injury, initial encounter.

EXAM:
LEFT KNEE - 3 VIEW

[w knee ap left]
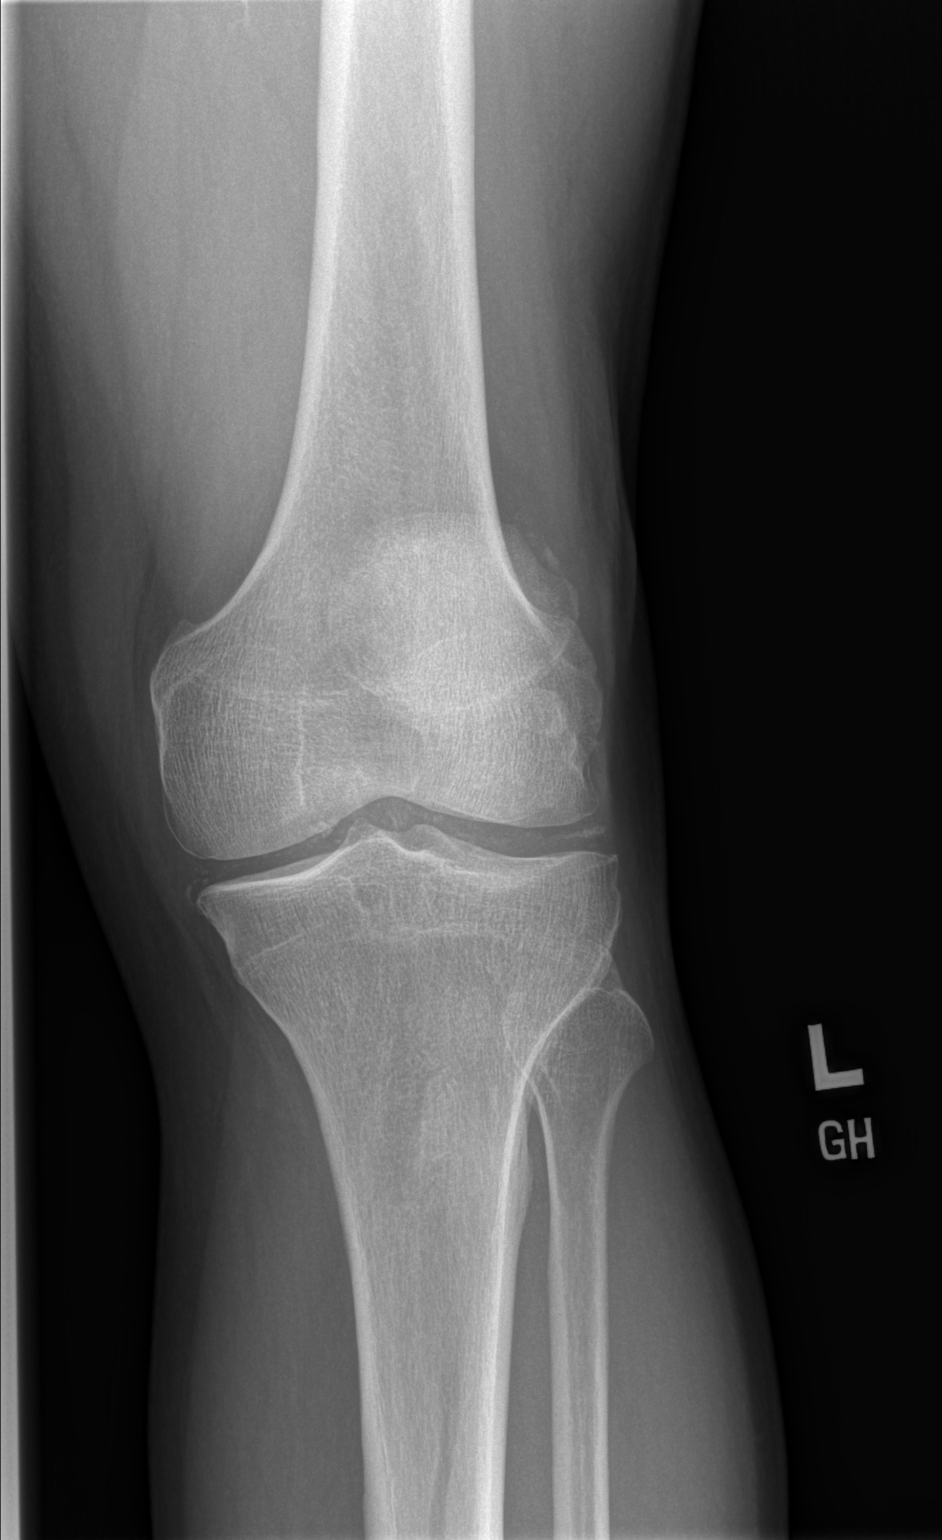

[w knee lat. left]
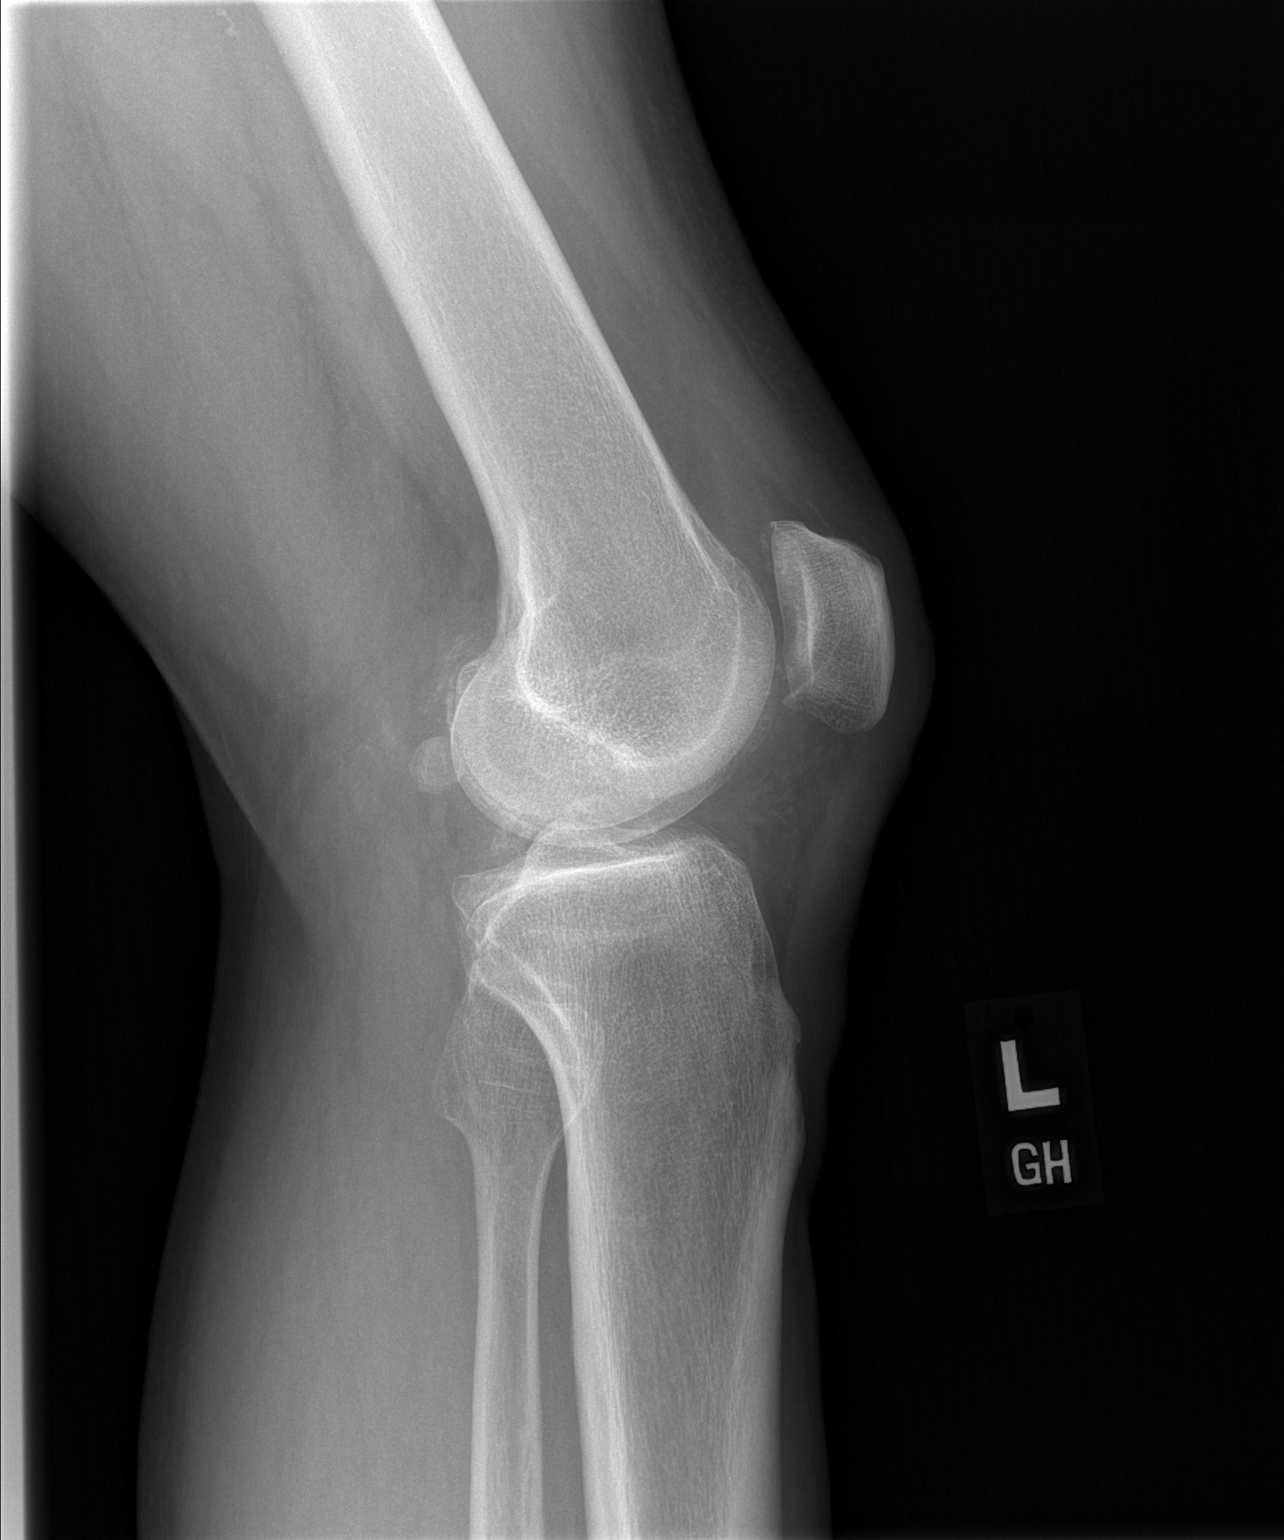

[t patella  left]
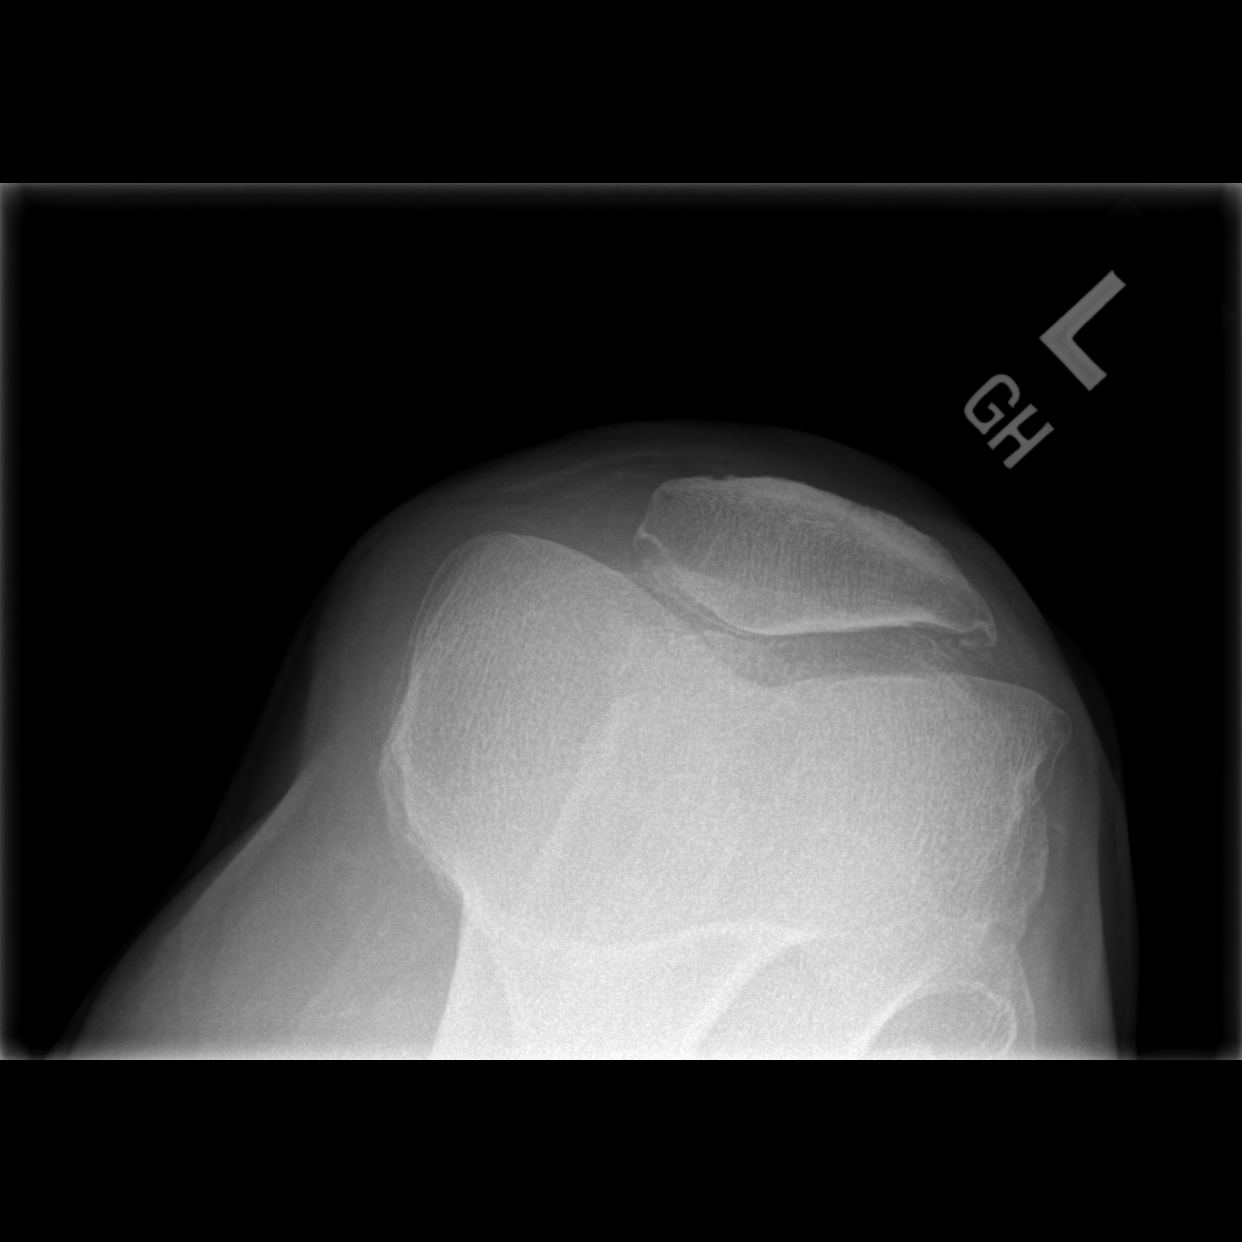

[3 of 3 positions shown; findings below may reference images not displayed]

FINDINGS: Mild degenerative changes are noted most prominent in the
patellofemoral space. No joint effusion is seen. No acute fracture
or dislocation is noted. Meniscal calcifications are noted.
IMPRESSION: Mild degenerative change without acute abnormality.

## 2024-03-12 DIAGNOSIS — C61 Malignant neoplasm of prostate: Secondary | ICD-10-CM | POA: Diagnosis not present

## 2024-03-12 DIAGNOSIS — N393 Stress incontinence (female) (male): Secondary | ICD-10-CM | POA: Diagnosis not present

## 2024-03-17 DIAGNOSIS — H5203 Hypermetropia, bilateral: Secondary | ICD-10-CM | POA: Diagnosis not present

## 2024-03-17 DIAGNOSIS — H0288A Meibomian gland dysfunction right eye, upper and lower eyelids: Secondary | ICD-10-CM | POA: Diagnosis not present

## 2024-03-17 DIAGNOSIS — H0288B Meibomian gland dysfunction left eye, upper and lower eyelids: Secondary | ICD-10-CM | POA: Diagnosis not present

## 2024-03-17 DIAGNOSIS — H16223 Keratoconjunctivitis sicca, not specified as Sjogren's, bilateral: Secondary | ICD-10-CM | POA: Diagnosis not present

## 2024-03-17 DIAGNOSIS — H524 Presbyopia: Secondary | ICD-10-CM | POA: Diagnosis not present

## 2024-03-17 DIAGNOSIS — H04123 Dry eye syndrome of bilateral lacrimal glands: Secondary | ICD-10-CM | POA: Diagnosis not present

## 2024-03-17 DIAGNOSIS — H52223 Regular astigmatism, bilateral: Secondary | ICD-10-CM | POA: Diagnosis not present

## 2024-04-09 DIAGNOSIS — I10 Essential (primary) hypertension: Secondary | ICD-10-CM | POA: Diagnosis not present

## 2024-04-09 DIAGNOSIS — Z0000 Encounter for general adult medical examination without abnormal findings: Secondary | ICD-10-CM | POA: Diagnosis not present

## 2024-04-09 DIAGNOSIS — R7309 Other abnormal glucose: Secondary | ICD-10-CM | POA: Diagnosis not present

## 2024-04-09 DIAGNOSIS — E7800 Pure hypercholesterolemia, unspecified: Secondary | ICD-10-CM | POA: Diagnosis not present

## 2024-07-07 DIAGNOSIS — R413 Other amnesia: Secondary | ICD-10-CM | POA: Diagnosis not present
